# Patient Record
Sex: Female | Born: 1955
Health system: Southern US, Community
[De-identification: ages and names within clinical notes are randomized; demographics above are authoritative.]

## PROBLEM LIST (undated history)

## (undated) DIAGNOSIS — M1990 Unspecified osteoarthritis, unspecified site: Secondary | ICD-10-CM

## (undated) DIAGNOSIS — R85619 Unspecified abnormal cytological findings in specimens from anus: Secondary | ICD-10-CM

## (undated) DIAGNOSIS — B009 Herpesviral infection, unspecified: Secondary | ICD-10-CM

## (undated) DIAGNOSIS — Z96649 Presence of unspecified artificial hip joint: Secondary | ICD-10-CM

## (undated) DIAGNOSIS — R7611 Nonspecific reaction to tuberculin skin test without active tuberculosis: Secondary | ICD-10-CM

## (undated) DIAGNOSIS — Z9289 Personal history of other medical treatment: Secondary | ICD-10-CM

## (undated) DIAGNOSIS — K219 Gastro-esophageal reflux disease without esophagitis: Secondary | ICD-10-CM

## (undated) DIAGNOSIS — D649 Anemia, unspecified: Secondary | ICD-10-CM

## (undated) DIAGNOSIS — J189 Pneumonia, unspecified organism: Secondary | ICD-10-CM

## (undated) DIAGNOSIS — R1033 Periumbilical pain: Secondary | ICD-10-CM

## (undated) DIAGNOSIS — C4491 Basal cell carcinoma of skin, unspecified: Secondary | ICD-10-CM

## (undated) DIAGNOSIS — Z8619 Personal history of other infectious and parasitic diseases: Secondary | ICD-10-CM

## (undated) HISTORY — DX: Herpesviral infection, unspecified: B00.9

## (undated) HISTORY — DX: Nonspecific reaction to tuberculin skin test without active tuberculosis: R76.11

## (undated) HISTORY — DX: Presence of unspecified artificial hip joint: Z96.649

## (undated) HISTORY — DX: Periumbilical pain: R10.33

## (undated) HISTORY — DX: Basal cell carcinoma of skin, unspecified: C44.91

## (undated) HISTORY — DX: Gastro-esophageal reflux disease without esophagitis: K21.9

## (undated) HISTORY — DX: Personal history of other infectious and parasitic diseases: Z86.19

## (undated) HISTORY — PX: LAPAROTOMY: SHX154

## (undated) HISTORY — DX: Unspecified abnormal cytological findings in specimens from anus: R85.619

---
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
## 1992-08-21 DIAGNOSIS — J189 Pneumonia, unspecified organism: Secondary | ICD-10-CM

## 1992-08-21 HISTORY — DX: Pneumonia, unspecified organism: J18.9

## 1998-11-18 ENCOUNTER — Ambulatory Visit (HOSPITAL_COMMUNITY): Admission: RE | Admit: 1998-11-18 | Discharge: 1998-11-18 | Payer: Self-pay | Admitting: Obstetrics and Gynecology

## 1999-05-30 ENCOUNTER — Ambulatory Visit (HOSPITAL_COMMUNITY): Admission: RE | Admit: 1999-05-30 | Discharge: 1999-05-30 | Payer: Self-pay | Admitting: Gastroenterology

## 1999-09-12 ENCOUNTER — Other Ambulatory Visit: Admission: RE | Admit: 1999-09-12 | Discharge: 1999-09-12 | Payer: Self-pay | Admitting: *Deleted

## 2000-01-17 ENCOUNTER — Encounter: Payer: Self-pay | Admitting: Internal Medicine

## 2000-01-17 ENCOUNTER — Ambulatory Visit (HOSPITAL_COMMUNITY): Admission: RE | Admit: 2000-01-17 | Discharge: 2000-01-17 | Payer: Self-pay | Admitting: Internal Medicine

## 2000-09-24 ENCOUNTER — Other Ambulatory Visit: Admission: RE | Admit: 2000-09-24 | Discharge: 2000-09-24 | Payer: Self-pay | Admitting: *Deleted

## 2000-12-31 ENCOUNTER — Emergency Department (HOSPITAL_COMMUNITY): Admission: EM | Admit: 2000-12-31 | Discharge: 2000-12-31 | Payer: Self-pay | Admitting: Emergency Medicine

## 2001-01-22 ENCOUNTER — Ambulatory Visit (HOSPITAL_COMMUNITY): Admission: RE | Admit: 2001-01-22 | Discharge: 2001-01-22 | Payer: Self-pay | Admitting: Internal Medicine

## 2001-01-22 ENCOUNTER — Encounter: Payer: Self-pay | Admitting: Internal Medicine

## 2001-11-05 ENCOUNTER — Other Ambulatory Visit: Admission: RE | Admit: 2001-11-05 | Discharge: 2001-11-05 | Payer: Self-pay | Admitting: Obstetrics and Gynecology

## 2002-01-23 ENCOUNTER — Ambulatory Visit (HOSPITAL_COMMUNITY): Admission: RE | Admit: 2002-01-23 | Discharge: 2002-01-23 | Payer: Self-pay | Admitting: Internal Medicine

## 2002-01-23 ENCOUNTER — Encounter: Payer: Self-pay | Admitting: Internal Medicine

## 2002-12-09 ENCOUNTER — Other Ambulatory Visit: Admission: RE | Admit: 2002-12-09 | Discharge: 2002-12-09 | Payer: Self-pay | Admitting: Obstetrics and Gynecology

## 2003-02-03 ENCOUNTER — Encounter: Payer: Self-pay | Admitting: Internal Medicine

## 2003-02-03 ENCOUNTER — Ambulatory Visit (HOSPITAL_COMMUNITY): Admission: RE | Admit: 2003-02-03 | Discharge: 2003-02-03 | Payer: Self-pay | Admitting: Internal Medicine

## 2004-01-05 ENCOUNTER — Other Ambulatory Visit: Admission: RE | Admit: 2004-01-05 | Discharge: 2004-01-05 | Payer: Self-pay | Admitting: Obstetrics and Gynecology

## 2004-02-15 ENCOUNTER — Ambulatory Visit (HOSPITAL_COMMUNITY): Admission: RE | Admit: 2004-02-15 | Discharge: 2004-02-15 | Payer: Self-pay | Admitting: Internal Medicine

## 2004-06-22 ENCOUNTER — Ambulatory Visit: Payer: Self-pay | Admitting: Internal Medicine

## 2004-07-01 ENCOUNTER — Ambulatory Visit: Payer: Self-pay | Admitting: Internal Medicine

## 2005-03-14 ENCOUNTER — Ambulatory Visit (HOSPITAL_COMMUNITY): Admission: RE | Admit: 2005-03-14 | Discharge: 2005-03-14 | Payer: Self-pay | Admitting: Internal Medicine

## 2005-07-26 ENCOUNTER — Ambulatory Visit: Payer: Self-pay | Admitting: Internal Medicine

## 2005-08-01 ENCOUNTER — Ambulatory Visit: Payer: Self-pay | Admitting: Internal Medicine

## 2005-08-02 ENCOUNTER — Encounter: Admission: RE | Admit: 2005-08-02 | Discharge: 2005-08-02 | Payer: Self-pay | Admitting: Internal Medicine

## 2006-03-28 ENCOUNTER — Ambulatory Visit (HOSPITAL_COMMUNITY): Admission: RE | Admit: 2006-03-28 | Discharge: 2006-03-28 | Payer: Self-pay | Admitting: Internal Medicine

## 2006-08-30 ENCOUNTER — Ambulatory Visit: Payer: Self-pay | Admitting: Internal Medicine

## 2006-08-30 LAB — CONVERTED CEMR LAB
AST: 27 units/L (ref 0–37)
Albumin: 3.9 g/dL (ref 3.5–5.2)
Alkaline Phosphatase: 50 units/L (ref 39–117)
CO2: 32 meq/L (ref 19–32)
Chloride: 102 meq/L (ref 96–112)
Chol/HDL Ratio, serum: 2.7
Cholesterol: 168 mg/dL (ref 0–200)
Eosinophil percent: 2.1 % (ref 0.0–5.0)
GFR calc non Af Amer: 93 mL/min
Glomerular Filtration Rate, Af Am: 113 mL/min/{1.73_m2}
Glucose, Bld: 88 mg/dL (ref 70–99)
LDL Cholesterol: 94 mg/dL (ref 0–99)
Monocytes Absolute: 0.5 10*3/uL (ref 0.2–0.7)
Platelets: 272 10*3/uL (ref 150–400)
Potassium: 4.2 meq/L (ref 3.5–5.1)
RDW: 11.8 % (ref 11.5–14.6)
Total Bilirubin: 1 mg/dL (ref 0.3–1.2)
VLDL: 12 mg/dL (ref 0–40)
WBC: 4.8 10*3/uL (ref 4.5–10.5)

## 2006-09-18 ENCOUNTER — Ambulatory Visit: Payer: Self-pay | Admitting: Internal Medicine

## 2006-09-28 ENCOUNTER — Ambulatory Visit: Payer: Self-pay | Admitting: Internal Medicine

## 2007-03-04 ENCOUNTER — Ambulatory Visit: Payer: Self-pay | Admitting: Internal Medicine

## 2007-04-29 ENCOUNTER — Ambulatory Visit (HOSPITAL_COMMUNITY): Admission: RE | Admit: 2007-04-29 | Discharge: 2007-04-29 | Payer: Self-pay | Admitting: Internal Medicine

## 2007-05-02 ENCOUNTER — Encounter: Admission: RE | Admit: 2007-05-02 | Discharge: 2007-05-02 | Payer: Self-pay | Admitting: Internal Medicine

## 2007-09-23 ENCOUNTER — Encounter: Payer: Self-pay | Admitting: Internal Medicine

## 2007-09-23 ENCOUNTER — Ambulatory Visit (HOSPITAL_COMMUNITY): Admission: RE | Admit: 2007-09-23 | Discharge: 2007-09-23 | Payer: Self-pay | Admitting: Gastroenterology

## 2007-10-08 ENCOUNTER — Encounter: Payer: Self-pay | Admitting: Internal Medicine

## 2007-10-08 DIAGNOSIS — K219 Gastro-esophageal reflux disease without esophagitis: Secondary | ICD-10-CM

## 2007-11-21 ENCOUNTER — Encounter: Admission: RE | Admit: 2007-11-21 | Discharge: 2007-11-21 | Payer: Self-pay | Admitting: Internal Medicine

## 2008-01-20 ENCOUNTER — Ambulatory Visit: Payer: Self-pay | Admitting: Internal Medicine

## 2008-01-20 DIAGNOSIS — R000 Tachycardia, unspecified: Secondary | ICD-10-CM

## 2008-01-27 LAB — CONVERTED CEMR LAB
AST: 20 units/L (ref 0–37)
Albumin: 3.8 g/dL (ref 3.5–5.2)
BUN: 9 mg/dL (ref 6–23)
CO2: 31 meq/L (ref 19–32)
Cholesterol: 154 mg/dL (ref 0–200)
Eosinophils Absolute: 0.1 10*3/uL (ref 0.0–0.7)
Eosinophils Relative: 1.1 % (ref 0.0–5.0)
Free T4: 0.8 ng/dL (ref 0.6–1.6)
GFR calc Af Amer: 113 mL/min
GFR calc non Af Amer: 93 mL/min
HDL: 52.3 mg/dL (ref >39.0)
MCV: 88.6 fL (ref 78.0–100.0)
Monocytes Absolute: 0.4 10*3/uL (ref 0.1–1.0)
Monocytes Relative: 8.3 % (ref 3.0–12.0)
Neutrophils Relative %: 68.4 % (ref 43.0–77.0)
RBC: 4.32 M/uL (ref 3.87–5.11)
RDW: 13.2 % (ref 11.5–14.6)
Sodium: 140 meq/L (ref 135–145)
T3, Free: 2.4 pg/mL (ref 2.3–4.2)
TSH: 2.14 microintl units/mL (ref 0.35–5.50)
Total CHOL/HDL Ratio: 2.9
Triglycerides: 54 mg/dL (ref 0–149)
VLDL: 11 mg/dL (ref 0–40)
WBC: 5.1 10*3/uL (ref 4.5–10.5)

## 2008-02-03 ENCOUNTER — Telehealth: Payer: Self-pay | Admitting: *Deleted

## 2008-02-04 ENCOUNTER — Ambulatory Visit: Payer: Self-pay

## 2008-02-04 ENCOUNTER — Encounter: Payer: Self-pay | Admitting: Internal Medicine

## 2008-03-03 ENCOUNTER — Ambulatory Visit: Payer: Self-pay | Admitting: Cardiology

## 2008-05-01 ENCOUNTER — Encounter: Admission: RE | Admit: 2008-05-01 | Discharge: 2008-05-01 | Payer: Self-pay | Admitting: Internal Medicine

## 2008-08-21 DIAGNOSIS — Z9289 Personal history of other medical treatment: Secondary | ICD-10-CM

## 2008-08-21 DIAGNOSIS — D649 Anemia, unspecified: Secondary | ICD-10-CM

## 2008-08-21 HISTORY — DX: Anemia, unspecified: D64.9

## 2008-08-21 HISTORY — PX: JOINT REPLACEMENT: SHX530

## 2008-08-21 HISTORY — DX: Personal history of other medical treatment: Z92.89

## 2009-02-15 ENCOUNTER — Telehealth: Payer: Self-pay | Admitting: *Deleted

## 2009-03-22 ENCOUNTER — Telehealth: Payer: Self-pay | Admitting: *Deleted

## 2009-05-04 ENCOUNTER — Ambulatory Visit: Payer: Self-pay | Admitting: Internal Medicine

## 2009-05-04 LAB — CONVERTED CEMR LAB
AST: 21 units/L (ref 0–37)
Albumin: 3.8 g/dL (ref 3.5–5.2)
Basophils Absolute: 0 10*3/uL (ref 0.0–0.1)
Basophils Relative: 0.3 % (ref 0.0–3.0)
Bilirubin Urine: NEGATIVE
Blood in Urine, dipstick: NEGATIVE
Cholesterol: 166 mg/dL (ref 0–200)
Eosinophils Absolute: 0.1 10*3/uL (ref 0.0–0.7)
Eosinophils Relative: 1.9 % (ref 0.0–5.0)
Glucose, Urine, Semiquant: NEGATIVE
Hemoglobin: 11.1 g/dL — ABNORMAL LOW (ref 12.0–15.0)
Ketones, urine, test strip: NEGATIVE
Lymphocytes Relative: 26.6 % (ref 12.0–46.0)
Lymphs Abs: 1.2 10*3/uL (ref 0.7–4.0)
MCHC: 32.8 g/dL (ref 30.0–36.0)
MCV: 82.6 fL (ref 78.0–100.0)
Monocytes Absolute: 0.4 10*3/uL (ref 0.1–1.0)
Monocytes Relative: 9.3 % (ref 3.0–12.0)
Nitrite: NEGATIVE
Specific Gravity, Urine: 1.025
TSH: 1.96 microintl units/mL (ref 0.35–5.50)
Total Bilirubin: 0.6 mg/dL (ref 0.3–1.2)
Urobilinogen, UA: 0.2
VLDL: 11.4 mg/dL (ref 0.0–40.0)

## 2009-05-06 ENCOUNTER — Ambulatory Visit (HOSPITAL_COMMUNITY): Admission: RE | Admit: 2009-05-06 | Discharge: 2009-05-06 | Payer: Self-pay | Admitting: Internal Medicine

## 2009-05-11 ENCOUNTER — Ambulatory Visit: Payer: Self-pay | Admitting: Internal Medicine

## 2009-05-11 DIAGNOSIS — M1610 Unilateral primary osteoarthritis, unspecified hip: Secondary | ICD-10-CM | POA: Insufficient documentation

## 2009-05-11 DIAGNOSIS — D649 Anemia, unspecified: Secondary | ICD-10-CM | POA: Insufficient documentation

## 2009-05-19 ENCOUNTER — Inpatient Hospital Stay (HOSPITAL_COMMUNITY): Admission: RE | Admit: 2009-05-19 | Discharge: 2009-05-22 | Payer: Self-pay | Admitting: Orthopedic Surgery

## 2009-06-21 ENCOUNTER — Encounter: Admission: RE | Admit: 2009-06-21 | Discharge: 2009-07-12 | Payer: Self-pay | Admitting: Orthopedic Surgery

## 2010-06-09 ENCOUNTER — Ambulatory Visit (HOSPITAL_COMMUNITY): Admission: RE | Admit: 2010-06-09 | Discharge: 2010-06-09 | Payer: Self-pay | Admitting: Internal Medicine

## 2010-08-21 DIAGNOSIS — K219 Gastro-esophageal reflux disease without esophagitis: Secondary | ICD-10-CM

## 2010-08-21 HISTORY — DX: Gastro-esophageal reflux disease without esophagitis: K21.9

## 2010-09-11 ENCOUNTER — Encounter: Payer: Self-pay | Admitting: Internal Medicine

## 2010-11-24 LAB — CBC
HCT: 27.8 % — ABNORMAL LOW (ref 36.0–46.0)
HCT: 28.3 % — ABNORMAL LOW (ref 36.0–46.0)
Hemoglobin: 9.2 g/dL — ABNORMAL LOW (ref 12.0–15.0)
MCV: 86.8 fL (ref 78.0–100.0)
Platelets: 190 10*3/uL (ref 150–400)
RDW: 15.3 % (ref 11.5–15.5)
RDW: 16.1 % — ABNORMAL HIGH (ref 11.5–15.5)

## 2010-11-24 LAB — BASIC METABOLIC PANEL
BUN: 4 mg/dL — ABNORMAL LOW (ref 6–23)
CO2: 27 mEq/L (ref 19–32)
Calcium: 7.7 mg/dL — ABNORMAL LOW (ref 8.4–10.5)
Potassium: 4.5 mEq/L (ref 3.5–5.1)

## 2010-11-24 LAB — PROTIME-INR
INR: 2.4 — ABNORMAL HIGH (ref 0.00–1.49)
Prothrombin Time: 25.5 seconds — ABNORMAL HIGH (ref 11.6–15.2)
Prothrombin Time: 25.6 seconds — ABNORMAL HIGH (ref 11.6–15.2)

## 2010-11-25 LAB — URINALYSIS, ROUTINE W REFLEX MICROSCOPIC
Glucose, UA: NEGATIVE mg/dL
Nitrite: NEGATIVE
Specific Gravity, Urine: 1.007 (ref 1.005–1.030)
Urobilinogen, UA: 0.2 mg/dL (ref 0.0–1.0)
pH: 6.5 (ref 5.0–8.0)

## 2010-11-25 LAB — ABO/RH: ABO/RH(D): A POS

## 2010-11-25 LAB — TYPE AND SCREEN: ABO/RH(D): A POS

## 2010-11-25 LAB — CBC
HCT: 35.7 % — ABNORMAL LOW (ref 36.0–46.0)
MCHC: 32.4 g/dL (ref 30.0–36.0)
MCHC: 33.3 g/dL (ref 30.0–36.0)
MCV: 84.1 fL (ref 78.0–100.0)
Platelets: 173 10*3/uL (ref 150–400)
RBC: 4.26 MIL/uL (ref 3.87–5.11)
WBC: 11.1 10*3/uL — ABNORMAL HIGH (ref 4.0–10.5)

## 2010-11-25 LAB — BASIC METABOLIC PANEL
BUN: 3 mg/dL — ABNORMAL LOW (ref 6–23)
Chloride: 104 mEq/L (ref 96–112)
Creatinine, Ser: 0.63 mg/dL (ref 0.4–1.2)

## 2010-11-25 LAB — COMPREHENSIVE METABOLIC PANEL
Albumin: 3.8 g/dL (ref 3.5–5.2)
Alkaline Phosphatase: 61 U/L (ref 39–117)
BUN: 12 mg/dL (ref 6–23)
Chloride: 105 mEq/L (ref 96–112)
Creatinine, Ser: 0.66 mg/dL (ref 0.4–1.2)
Glucose, Bld: 84 mg/dL (ref 70–99)

## 2010-11-25 LAB — PROTIME-INR
INR: 1 (ref 0.00–1.49)
Prothrombin Time: 12.8 seconds (ref 11.6–15.2)
Prothrombin Time: 15.7 seconds — ABNORMAL HIGH (ref 11.6–15.2)

## 2010-11-25 LAB — PREGNANCY, URINE: Preg Test, Ur: NEGATIVE

## 2011-01-03 NOTE — Op Note (Signed)
Katherine Rodriguez, Katherine Rodriguez                 ACCOUNT NO.:  [id number]   MEDICAL RECORD NO.:  [id number]          PATIENT TYPE:  AMB   LOCATION:  ENDO                         FACILITY:  [facility]   PHYSICIAN:  James L. Malon Kindle., M.D.DATE OF BIRTH:  October 24, 1955   DATE OF PROCEDURE:  09/23/2007  DATE OF DISCHARGE:                               OPERATIVE REPORT   PROCEDURE:  Esophagogastroduodenoscopy.   MEDICATIONS:  Phenergan 12.5 mg, Hurricaine spray, fentanyl 35 mg,  Versed 3.5 mg IV.   INDICATIONS FOR PROCEDURE:  Persistent dyspepsia despite the use of  Nexium.   DESCRIPTION OF PROCEDURE:  The procedure was explained to the patient  and consent obtained.  In the left lateral decubitus position, the  Pentax upper endoscope was inserted and advanced into the esophagus.  The distal and proximal esophagus were seen well.  The distal esophagus  was reddened.  The GE junction was widely patent.  There were no signs  of Barrett's esophagus.  No esophagitis, ulcerations, etc.  A hiatal  hernia was only approximately 2 cm in length.  The stomach was entered.  The pylorus identified and passed.  The  duodenum, including the bulb  and second portion, were seen well and were unremarkable.  The pyloric  channel, antrum, and body were normal.  The fundus and cardia were seen  well on the retroflex view and were normal.  The scope was withdrawn.  The initial findings of the esophagus were confirmed.   ASSESSMENT:  Gastroesophageal reflux disease with no signs of Barrett's  esophagus.   PLAN:  We will continue conservative therapy with Nexium, antireflux  measures.           ______________________________  Llana Aliment Malon Kindle., M.D.     Waldron Session  D:  09/23/2007  T:  09/23/2007  Job:  [id number]   cc:   Neta Mends. Fabian Sharp, MD

## 2011-01-03 NOTE — Op Note (Signed)
Katherine Rodriguez, Katherine Rodriguez                 ACCOUNT NO.:  [id number]   MEDICAL RECORD NO.:  [id number]          PATIENT TYPE:  AMB   LOCATION:  ENDO                         FACILITY:  [facility]   PHYSICIAN:  James L. Malon Kindle., M.D.DATE OF BIRTH:  [birth year]   DATE OF PROCEDURE:  09/23/2007  DATE OF DISCHARGE:                               OPERATIVE REPORT   PROCEDURE:  Colonoscopy.   MEDICATIONS:  The patient received a total of Phenergan 12.5 mg,  fentanyl 85 mg and Versed 8.5 mg IV for both procedures.   INDICATIONS:  Strong family history of colon polyps.  This is done as a  5-year follow-up colonoscopy.   DESCRIPTION OF PROCEDURE:  Following endoscopy the patient was turned  around and the Pentax pediatric scope was inserted and advanced.  Prep  excellent.  The cecum reached without difficulty.  Scope withdrawn.  Cecum, ascending, transverse, descending, sigmoid colon seen well.  No  abnormalities seen.  Rectum was examined in the forward and retroflex  view and was normal.  There were some small hemorrhoids.  Pertinent  negatives were polyps, diverticula, tumors, masses, melanosis etc.  The  patient tolerated the procedure well, was monitored throughout.   ASSESSMENT:  Strong family history colon polyps in her father with a  negative colonoscopy at this time.   PLAN:  We will follow the patient clinically and recommend a 5-year  repeat colonoscopy.           ______________________________  Llana Aliment. Malon Kindle., M.D.     Waldron Session  D:  09/23/2007  T:  09/23/2007  Job:  [id number]   cc:   Neta Mends. Fabian Sharp, MD

## 2011-01-03 NOTE — Assessment & Plan Note (Signed)
[facility]                            CARDIOLOGY OFFICE NOTE   Katherine Rodriguez                        MRN:          [id number]  DATE:03/03/2008                            DOB:          18-Dec-1955    CHIEF COMPLAINT:  Rapid heart beat.   HISTORY OF PRESENT ILLNESS:  Katherine Rodriguez is a delightful 56 year old  married white female, former nurse at Neuro Intensive Care Unit at [employer],  who comes today with the above complaint.  She had a bad episode in  March which was evaluated by Dr. Fabian Sharp.  She has apparently had her  thyroid checked on thyroid replacement, has been euthyroid.  She then  had an episode at the end of May when she was at work.  Within about 2  minutes one of her co-workers checked her heart rate which was running  about 110-120 and regular.  Her blood pressure was 110.   She describes the events as coming on suddenly, but gradually going  away.  She has had no syncope, presyncope, chest pain, or diaphoresis.  This is fairly infrequent having only two episodes since March.   She has had no previous cardiac history.   Since these started, she has cut way back on caffeine which seems to  have helped.   PAST MEDICAL HISTORY:  She smoked up until 1988.  She has less than  three alcoholic beverages a month.  She does not binge drink.  She has  cut her caffeine way back.  She walks about 1-2 miles 3 times a week.   CURRENT MEDICATIONS:  1. Nexium 40 mg a day.  2. Multivitamin daily.  3. Osteo Bi-Flex daily.   ALLERGIES:  She has no known drug allergies.   PAST SURGICAL HISTORY:  Partial thyroidectomy in 1973, laparoscopy x2  for endometriosis in 1985-1987.   FAMILY HISTORY:  Her family history is negative for premature coronary  disease.  Her brother has an irregular heart beat at age 8.   SOCIAL HISTORY:  She is married, and has one child.  She lives in  [place].  She does preop PACU and discharge at an outpatient surgical  center.   REVIEW OF SYSTEMS:  She has some chronic constipation, hiatal hernia,  reflux, menstrual dysfunction which she thinks may be related to  palpitations and arthritis.  Other review of systems are negative.   PHYSICAL EXAMINATION:  VITAL SIGNS:  Her blood pressure today is 102/60,  her pulse 62 and regular.  She is 5 feet 4.5, weighs 146 pounds.  HEENT:  Normocephalic, atraumatic.  PERRLA.  Extraocular movement is  intact.  Sclerae are clear.  She wears glasses.  Facial symmetry is  normal.  Dentition satisfactory.  NECK:  Supple.  Carotids show good upstroke bilaterally without bruits.  She has a thyroidectomy scar.  There is no thyromegaly.  LUNGS:  Clear to auscultation and percussion.  HEART:  Reveals a regular rate and rhythm.  Nondisplaced PMI.  There is  no click.  ABDOMEN:  Exam is soft.  Good bowel sounds.  No midline  bruit.  No  hepatomegaly.  EXTREMITIES:  No cyanosis, clubbing, or edema.  Pulses are intact.  NEURO:  Exam is intact.   EKG:  Her EKG is completely normal.  A 2D echocardiogram obtained in  this office on February 04, 2008, was completely normal.   ASSESSMENT:  Tachy palpitations most likely not supraventricular  tachycardia, atrial fibrillation or flutter.  These are fairly  infrequent and have seem to have gotten better with conservative changes  in her lifestyle including decreasing caffeine.  Some of this may be  perimenopausal.   RECOMMENDATIONS:  1. Low-dose estrogen if this helps other menopausal symptoms.  2. Reassurance.  3. Continue to increase exercise and decrease caffeine intake.  4. See me back if she starts having sustained or frequent arrhythmias.      She knows to obtain a 12-lead EKG if she is in it for a significant      period of time.  Otherwise, I will see her back p.r.n.     Thomas C. Daleen Squibb, MD, [facility]  Electronically Signed    TCW/MedQ  DD: 03/03/2008  DT: 03/04/2008  Job #: [id number]   cc:   Neta Mends. Fabian Sharp, MD

## 2011-03-06 ENCOUNTER — Other Ambulatory Visit (HOSPITAL_COMMUNITY): Payer: Self-pay | Admitting: Orthopedic Surgery

## 2011-03-06 DIAGNOSIS — F430 Acute stress reaction: Secondary | ICD-10-CM

## 2011-03-14 ENCOUNTER — Encounter (HOSPITAL_COMMUNITY)
Admission: RE | Admit: 2011-03-14 | Discharge: 2011-03-14 | Disposition: A | Discharge status: Home / Self Care | Payer: 59 | Source: Ambulatory Visit | Attending: Orthopedic Surgery | Admitting: Orthopedic Surgery

## 2011-03-14 ENCOUNTER — Ambulatory Visit (HOSPITAL_COMMUNITY)
Admission: RE | Admit: 2011-03-14 | Discharge: 2011-03-14 | Disposition: A | Discharge status: Home / Self Care | Payer: 59 | Source: Ambulatory Visit | Attending: Orthopedic Surgery | Admitting: Orthopedic Surgery

## 2011-03-14 DIAGNOSIS — Z96649 Presence of unspecified artificial hip joint: Secondary | ICD-10-CM | POA: Insufficient documentation

## 2011-03-14 DIAGNOSIS — F430 Acute stress reaction: Secondary | ICD-10-CM

## 2011-03-14 DIAGNOSIS — M79609 Pain in unspecified limb: Secondary | ICD-10-CM | POA: Insufficient documentation

## 2011-03-14 MED ORDER — TECHNETIUM TC 99M MEDRONATE IV KIT
25.0000 | PACK | Freq: Once | INTRAVENOUS | Status: AC | PRN
  Administered 2011-03-14: 25 via INTRAVENOUS

## 2011-03-29 ENCOUNTER — Telehealth: Payer: Self-pay | Admitting: Internal Medicine

## 2011-03-29 DIAGNOSIS — Z13820 Encounter for screening for osteoporosis: Secondary | ICD-10-CM

## 2011-03-29 NOTE — Telephone Encounter (Signed)
Pt called and said that she is having some stress reaction activity at tip of prosthesis. Pt is req to get a bone density scan ordered before cpx in December.

## 2011-03-30 NOTE — Telephone Encounter (Signed)
Left message on machine that we will call her back about scheduling the bone density once md addresses the message.

## 2011-03-30 NOTE — Telephone Encounter (Signed)
Dr. Fabian Sharp would need to order this

## 2011-04-05 NOTE — Telephone Encounter (Signed)
Left message to call back  

## 2011-04-05 NOTE — Telephone Encounter (Signed)
Ok to schedule DEXA  .Dx post menopausal,  Abnormal bone scan,   Osteopenia.

## 2011-04-06 NOTE — Telephone Encounter (Signed)
Pt called back and has never had a dexa done. I scheduled appt for 8/23 at 9am. Left message for pt about this.

## 2011-04-13 ENCOUNTER — Ambulatory Visit (INDEPENDENT_AMBULATORY_CARE_PROVIDER_SITE_OTHER)
Admission: RE | Admit: 2011-04-13 | Discharge: 2011-04-13 | Disposition: A | Discharge status: Home / Self Care | Payer: 59 | Source: Ambulatory Visit

## 2011-04-13 DIAGNOSIS — Z13820 Encounter for screening for osteoporosis: Secondary | ICD-10-CM

## 2011-04-28 ENCOUNTER — Encounter: Payer: Self-pay | Admitting: *Deleted

## 2011-06-13 ENCOUNTER — Other Ambulatory Visit: Payer: Self-pay | Admitting: Internal Medicine

## 2011-06-13 DIAGNOSIS — Z1231 Encounter for screening mammogram for malignant neoplasm of breast: Secondary | ICD-10-CM

## 2011-07-12 ENCOUNTER — Other Ambulatory Visit (INDEPENDENT_AMBULATORY_CARE_PROVIDER_SITE_OTHER): Payer: 59

## 2011-07-12 DIAGNOSIS — Z0000 Encounter for general adult medical examination without abnormal findings: Secondary | ICD-10-CM

## 2011-07-12 LAB — CBC WITH DIFFERENTIAL/PLATELET
Basophils Absolute: 0 10*3/uL (ref 0.0–0.1)
Lymphocytes Relative: 29.2 % (ref 12.0–46.0)
Monocytes Relative: 10.5 % (ref 3.0–12.0)
Platelets: 219 10*3/uL (ref 150.0–400.0)
RDW: 13 % (ref 11.5–14.6)

## 2011-07-12 LAB — BASIC METABOLIC PANEL
BUN: 13 mg/dL (ref 6–23)
Calcium: 8.9 mg/dL (ref 8.4–10.5)
Creatinine, Ser: 0.7 mg/dL (ref 0.4–1.2)
GFR: 96.43 mL/min (ref >60.00)
Glucose, Bld: 82 mg/dL (ref 70–99)

## 2011-07-12 LAB — HEPATIC FUNCTION PANEL
AST: 20 U/L (ref 0–37)
Alkaline Phosphatase: 58 U/L (ref 39–117)
Total Bilirubin: 0.9 mg/dL (ref 0.3–1.2)

## 2011-07-12 LAB — POCT URINALYSIS DIPSTICK
Blood, UA: NEGATIVE
Ketones, UA: NEGATIVE
Leukocytes, UA: NEGATIVE
Protein, UA: NEGATIVE
pH, UA: 8.5

## 2011-07-12 LAB — LIPID PANEL
HDL: 62.2 mg/dL (ref >39.00)
Total CHOL/HDL Ratio: 2
Triglycerides: 66 mg/dL (ref 0.0–149.0)
VLDL: 13.2 mg/dL (ref 0.0–40.0)

## 2011-07-12 LAB — TSH: TSH: 1.76 u[IU]/mL (ref 0.35–5.50)

## 2011-07-18 ENCOUNTER — Ambulatory Visit (HOSPITAL_COMMUNITY)
Admission: RE | Admit: 2011-07-18 | Discharge: 2011-07-18 | Disposition: A | Discharge status: Home / Self Care | Payer: 59 | Source: Ambulatory Visit | Attending: Internal Medicine | Admitting: Internal Medicine

## 2011-07-18 DIAGNOSIS — Z1231 Encounter for screening mammogram for malignant neoplasm of breast: Secondary | ICD-10-CM | POA: Insufficient documentation

## 2011-07-31 ENCOUNTER — Encounter: Payer: Self-pay | Admitting: Internal Medicine

## 2011-08-01 ENCOUNTER — Ambulatory Visit (INDEPENDENT_AMBULATORY_CARE_PROVIDER_SITE_OTHER): Payer: 59 | Admitting: Internal Medicine

## 2011-08-01 ENCOUNTER — Encounter: Payer: Self-pay | Admitting: Internal Medicine

## 2011-08-01 VITALS — BP 100/70 | HR 60 | Ht 64.25 in | Wt 140.0 lb

## 2011-08-01 DIAGNOSIS — Z96649 Presence of unspecified artificial hip joint: Secondary | ICD-10-CM

## 2011-08-01 DIAGNOSIS — R141 Gas pain: Secondary | ICD-10-CM

## 2011-08-01 DIAGNOSIS — Z0000 Encounter for general adult medical examination without abnormal findings: Secondary | ICD-10-CM

## 2011-08-01 DIAGNOSIS — Z8619 Personal history of other infectious and parasitic diseases: Secondary | ICD-10-CM

## 2011-08-01 DIAGNOSIS — M949 Disorder of cartilage, unspecified: Secondary | ICD-10-CM

## 2011-08-01 DIAGNOSIS — M899 Disorder of bone, unspecified: Secondary | ICD-10-CM

## 2011-08-01 DIAGNOSIS — M8580 Other specified disorders of bone density and structure, unspecified site: Secondary | ICD-10-CM | POA: Insufficient documentation

## 2011-08-01 DIAGNOSIS — R140 Abdominal distension (gaseous): Secondary | ICD-10-CM | POA: Insufficient documentation

## 2011-08-01 HISTORY — DX: Presence of unspecified artificial hip joint: Z96.649

## 2011-08-01 HISTORY — DX: Personal history of other infectious and parasitic diseases: Z86.19

## 2011-08-01 MED ORDER — VALACYCLOVIR HCL 1 G PO TABS: ORAL_TABLET | ORAL | Status: AC

## 2011-08-01 MED ORDER — POLYETHYLENE GLYCOL 3350 17 GM/SCOOP PO POWD: 17.0000 g | Freq: Every day | ORAL | Status: DC

## 2011-08-01 NOTE — Progress Notes (Signed)
Subjective:    Patient ID: Katherine Rodriguez, female    DOB: Jan 08, 1956, 55 y.o.   MRN: [id number]  HPI Patient comes in today for Preventive Health Care visit  No major change in health status since last visit . except  Except  Hip replacement   Sept 2010 total right hip and had .An helped  And had dexa. Because of some sx but has done well since then  Issues irreg gas bloated at times  Using miralax q d  Some fiber.  Sore hemorrhoid and some bleeding. Last colon 7-8 years ago.   May be partial menopausal Review of Systems ROS:  GEN/ HEENTNo fever, significant weight changes sweats headaches vision problems hearing changes, CV/ PULM; No chest pain shortness of breath cough, syncope,edema  change in exercise tolerance. GI /GU: No adominal pain, vomiting, No blood in the stool. No significant GU symptoms. SKIN/HEME: ,no acute skin rashes suspicious lesions or bleeding. No lymphadenopathy, nodules, masses.  NEURO/ PSYCH:  No neurologic signs such as weakness numbness No depression anxiety. IMM/ Allergy: No unusual infections.  Allergy .   REST of 12 system review negative Past Medical History  Diagnosis Date  . Endometriosis   . Abnormal Pap smear of anus   . PPD positive   . GERD (gastroesophageal reflux disease)   . Recurrent HSV (herpes simplex virus)     History   Social History  . Marital Status: Married    Spouse Name: N/A    Number of Children: N/A  . Years of Education: N/A   Occupational History  . Not on file.   Social History Main Topics  . Smoking status: Never Smoker   . Smokeless tobacco: Not on file  . Alcohol Use: Yes     rare  . Drug Use: Not on file  . Sexually Active: Not on file   Other Topics Concern  . Not on file   Social History Narrative   MarriedRegular exercise- yesCutting back on caffeineSleep 7 HH 3 pet dog and cat. G4 P1     Past Surgical History  Procedure Date  . Thyroidectomy, partial   . Laparotomy     exploratory    Family  History  Problem Relation Age of Onset  . Hypertension Mother   . Arthritis Mother     spinal stenosis  . Lymphoma    . Other      bowel disease    No Known Allergies  Current Outpatient Prescriptions on File Prior to Visit  Medication Sig Dispense Refill  . naproxen sodium (ALEVE) 220 MG tablet Take 220 mg by mouth 2 (two) times daily with a meal.          BP 100/70  Pulse 60  Ht 5' 4.25" (1.632 m)  Wt 140 lb (63.504 kg)  BMI 23.84 kg/m2       Objective:   Physical Exam Physical Exam: Vital signs reviewed WUJ:WJXB is a well-developed well-nourished alert cooperative  white female who appears her stated age in no acute distress.  HEENT: normocephalic atraumatic , Eyes: PERRL EOM's full, conjunctiva clear, Nares: paten,t no deformity discharge or tenderness., Ears: no deformity EAC's clear TMs with normal landmarks. Mouth: clear OP, no lesions, edema.  Moist mucous membranes. Dentition in adequate repair. NECK: supple without masses, thyromegaly or bruits. CHEST/PULM:  Clear to auscultation and percussion breath sounds equal no wheeze , rales or rhonchi. No chest wall deformities or tenderness. CV: PMI is nondisplaced, S1 S2 no gallops, murmurs,  rubs. Peripheral pulses are full without delay.No JVD . Breast: normal by inspection . No dimpling, discharge, masses, tenderness or discharge .  ABDOMEN: Bowel sounds normal nontender  No guard or rebound, no hepato splenomegal no CVA tenderness.  No hernia. Extremtities:  No clubbing cyanosis or edema, no acute joint swelling or redness no focal atrophy NEURO:  Oriented x3, cranial nerves 3-12 appear to be intact, no obvious focal weakness,gait within normal limits no abnormal reflexes or asymmetrical SKIN: No acute rashes normal turgor, color, no bruising or petechiae. PSYCH: Oriented, good eye contact, no obvious depression anxiety, cognition and judgment appear normal. LN: no cervical axillary inguinal adenopathy  Lab Results    Component Value Date   WBC 4.6 07/12/2011   HGB 13.1 07/12/2011   HCT 39.5 07/12/2011   PLT 219.0 07/12/2011   GLUCOSE 82 07/12/2011   CHOL 153 07/12/2011   TRIG 66.0 07/12/2011   HDL 62.20 07/12/2011   LDLCALC 78 07/12/2011   ALT 24 07/12/2011   AST 20 07/12/2011   NA 142 07/12/2011   K 4.4 07/12/2011   CL 106 07/12/2011   CREATININE 0.7 07/12/2011   BUN 13 07/12/2011   CO2 32 07/12/2011   TSH 1.76 07/12/2011   INR 2.4* 05/22/2009  DEXA   LSSP -1.0 and Hip -1.9       Assessment & Plan:  Preventive Health Care Counseled regarding healthy nutrition, exercise, sleep, injury prevention, calcium vit d and healthy weight . Osteopenia  Mild    Disc ca vit d   Sp hip replacement hsv ; Prn valtrex .   Constipation and poss change in bowel habits . To see her GE about this  May just be ibs type problem.

## 2011-08-01 NOTE — Patient Instructions (Signed)
Continue lifestyle intervention healthy eating and exercise . You have changes of osteopenia .  Would continue diet with vitamin d and calcium .   Get repeat DEXA bone density  When 58 years.

## 2011-10-18 ENCOUNTER — Other Ambulatory Visit: Payer: Self-pay | Admitting: Gastroenterology

## 2011-10-18 DIAGNOSIS — K921 Melena: Secondary | ICD-10-CM

## 2011-10-18 DIAGNOSIS — R198 Other specified symptoms and signs involving the digestive system and abdomen: Secondary | ICD-10-CM

## 2011-11-06 ENCOUNTER — Ambulatory Visit
Admission: RE | Admit: 2011-11-06 | Discharge: 2011-11-06 | Disposition: A | Discharge status: Home / Self Care | Payer: 59 | Source: Ambulatory Visit | Attending: Gastroenterology | Admitting: Gastroenterology

## 2011-11-06 DIAGNOSIS — R198 Other specified symptoms and signs involving the digestive system and abdomen: Secondary | ICD-10-CM

## 2011-11-06 DIAGNOSIS — K921 Melena: Secondary | ICD-10-CM

## 2012-03-11 ENCOUNTER — Other Ambulatory Visit: Payer: Self-pay | Admitting: Internal Medicine

## 2012-07-17 ENCOUNTER — Other Ambulatory Visit: Payer: Self-pay | Admitting: Internal Medicine

## 2012-07-17 DIAGNOSIS — Z1231 Encounter for screening mammogram for malignant neoplasm of breast: Secondary | ICD-10-CM

## 2012-07-31 ENCOUNTER — Encounter: Payer: Self-pay | Admitting: Internal Medicine

## 2012-07-31 ENCOUNTER — Encounter: Payer: Self-pay | Admitting: Family Medicine

## 2012-07-31 ENCOUNTER — Ambulatory Visit (INDEPENDENT_AMBULATORY_CARE_PROVIDER_SITE_OTHER): Payer: 59 | Admitting: Internal Medicine

## 2012-07-31 VITALS — BP 102/64 | HR 86 | Temp 98.6°F | Wt 141.0 lb

## 2012-07-31 DIAGNOSIS — J040 Acute laryngitis: Secondary | ICD-10-CM

## 2012-07-31 DIAGNOSIS — J069 Acute upper respiratory infection, unspecified: Secondary | ICD-10-CM

## 2012-07-31 NOTE — Progress Notes (Signed)
Chief Complaint  Patient presents with  . Hoarse  . Sore Throat  . Otalgia  . Headache    Started over the weekend and has gotten worse.  . Nasal Congestion  . Cough    HPI: Patient comes in today for SDA for  new problem evaluation. Onset less that week  Sore throat and  Congestion and now hoarse  .  Marland Kitchen  No fever .  Increase coughing and slight production.    No sob.   Trying alka selter plus ibu and sudafed  Helped with head congestion.   Ear pain left more than right.   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  Past Medical History  Diagnosis Date  . Endometriosis   . Abnormal Pap smear of anus   . PPD positive   . GERD (gastroesophageal reflux disease)   . Recurrent HSV (herpes simplex virus)   . S/P hip replacement 08/01/2011  . History of herpes labialis 08/01/2011    Family History  Problem Relation Age of Onset  . Hypertension Mother   . Arthritis Mother     spinal stenosis  . Lymphoma    . Other      bowel disease    History   Social History  . Marital Status: Married    Spouse Name: N/A    Number of Children: N/A  . Years of Education: N/A   Social History Main Topics  . Smoking status: Never Smoker   . Smokeless tobacco: None  . Alcohol Use: Yes     Comment: rare  . Drug Use: None  . Sexually Active: None   Other Topics Concern  . None   Social History Narrative   MarriedRegular exercise- yesCutting back on caffeineSleep 7 HH 3 pet dog and cat. Works hospital nursingG4 P1     Outpatient Encounter Prescriptions as of 07/31/2012  Medication Sig Dispense Refill  . celecoxib (CELEBREX) 200 MG capsule Take 200 mg by mouth daily.      . naproxen sodium (ALEVE) 220 MG tablet Take 220 mg by mouth 2 (two) times daily with a meal.        . polyethylene glycol powder (GLYCOLAX/MIRALAX) powder TAKE 17 G (DISSOLVED IN 8 OZ LIQUID) BY MOUTH DAILY.  527 g  1  . valACYclovir (VALTREX) 1000 MG tablet 2 po bid as  directed  30 tablet  3    EXAM:  BP 102/64  Pulse 86  Temp 98.6 F (37 C) (Oral)  Wt 141 lb (63.957 kg)  SpO2 98%  There is no height on file to calculate BMI.  GENERAL: vitals reviewed and listed above, alert, oriented, appears well hydrated and in no acute distress  WDWN in NAD  quiet respirations; mildly congested  hoarse. Non toxic .no stridor  HEENT: Normocephalic ;atraumatic , Eyes;  PERRL, EOMs  Full, lids and conjunctiva clear,,Ears: no deformities, canals nl, TM landmarks normal, Nose: no deformity or discharge but congested;face non tender Mouth : OP clear without lesion or edema . Neck: Supple without adenopathy or masses or bruits Chest:  Clear to A&P without wheezes rales or rhonchi CV:  S1-S2 no gallops or murmurs peripheral perfusion is normal Skin :nl perfusion and no acute rashes   PSYCH: pleasant and cooperative, no obvious depression or anxiety  ASSESSMENT AND PLAN:  Discussed the following assessment and plan:  1. Acute laryngitis    viral   2. URI, acute    -  Patient advised to return or notify health care team  immediately if symptoms worsen or persist or new concerns arise.  Patient Instructions  Your ears are not infected today. This acts like a viral laryngitis upper respiratory infection. Should run its course and go back to work on Monday. Relative voice rest warm liquids symptom treatment Sudafed during the day can use Afrin nose spray at night for 2-3 nights. Contact us if persistent or progressive lasting more than 10-14 days without improvement. Or high fever shortness of breath.  Contact our office if you require  rx cough med for comfort .    INSTRUCTIONS FOR UPPER RESPIRATORY INFECTION:  -plenty of rest and fluids  -nasal saline wash 2-3 times daily (use prepackaged nasal saline or bottled/distilled water if making your own)  -clean nose with nasal saline before using   sinex  afrin -can use sinex nasal spray for drainage and nasal  congestion - but do NOT use longer then 3-4 days overuses can cause rebound problems . Can alternate nostrils. -can use tylenol or ibuprofen as directed for aches and sorethroat  -in the winter time, using a humidifier at night is helpful (please follow cleaning instructions)  -if you are taking a cough medication - use only as directed, may also try a teaspoon of honey to coat the throat and throat lozenges for short term use. -for sore throat, salt water gargles can help  -follow up if you have fevers, facial pain, tooth pain, difficulty breathing or are worsening or not beginning to get better in 7 days      Laryngitis At the top of your windpipe is your voice box. It is the source of your voice. Inside your voice box are 2 bands of muscles called vocal cords. When you breathe, your vocal cords are relaxed and open so that air can get into the lungs. When you decide to say something, these cords come together and vibrate. The sound from these vibrations goes into your throat and comes out through your mouth as sound. Laryngitis is an inflammation of the vocal cords that causes hoarseness, cough, loss of voice, sore throat, and dry throat. Laryngitis can be temporary (acute) or long-term (chronic). Most cases of acute laryngitis improve with time.Chronic laryngitis lasts for more than 3 weeks. CAUSES Laryngitis can often be related to excessive smoking, talking, or yelling, as well as inhalation of toxic fumes and allergies. Acute laryngitis is usually caused by a viral infection, vocal strain, measles or mumps, or bacterial infections. Chronic laryngitis is usually caused by vocal cord strain, vocal cord injury, postnasal drip, growths on the vocal cords, or acid reflux. SYMPTOMS   Cough.  Sore throat.  Dry throat. RISK FACTORS  Respiratory infections.  Exposure to irritating substances, such as cigarette smoke, excessive amounts of alcohol, stomach acids, and workplace  chemicals.  Voice trauma, such as vocal cord injury from shouting or speaking too loud. DIAGNOSIS  Your cargiver will perform a physical exam. During the physical exam, your caregiver will examine your throat. The most common sign of laryngitis is hoarseness. Laryngoscopy may be necessary to confirm the diagnosis of this condition. This procedure allows your caregiver to look into the larynx. HOME CARE INSTRUCTIONS  Drink enough fluids to keep your urine clear or pale yellow.  Rest until you no longer have symptoms or as directed by your caregiver.  Breathe in moist air.  Take all medicine as directed by your caregiver.  Do not smoke.  Talk as  little as possible (this includes whispering).  Write on paper instead of talking until your voice is back to normal.  Follow up with your caregiver if your condition has not improved after 10 days. SEEK MEDICAL CARE IF:   You have trouble breathing.  You cough up blood.  You have persistent fever.  You have increasing pain.  You have difficulty swallowing. MAKE SURE YOU:  Understand these instructions.  Will watch your condition.  Will get help right away if you are not doing well or get worse. Document Released: 08/07/2005 Document Revised: 10/30/2011 Document Reviewed: 10/13/2010 [facility] Patient Information 2013 [place], [place].      Neta Mends. Kiaya Haliburton M.D.

## 2012-07-31 NOTE — Patient Instructions (Addendum)
Your ears are not infected today. This acts like a viral laryngitis upper respiratory infection. Should run its course and go back to work on Monday. Relative voice rest warm liquids symptom treatment Sudafed during the day can use Afrin nose spray at night for 2-3 nights. Contact us if persistent or progressive lasting more than 10-14 days without improvement. Or high fever shortness of breath.  Contact our office if you require  rx cough med for comfort .    INSTRUCTIONS FOR UPPER RESPIRATORY INFECTION:  -plenty of rest and fluids  -nasal saline wash 2-3 times daily (use prepackaged nasal saline or bottled/distilled water if making your own)  -clean nose with nasal saline before using   sinex  afrin -can use sinex nasal spray for drainage and nasal congestion - but do NOT use longer then 3-4 days overuses can cause rebound problems . Can alternate nostrils. -can use tylenol or ibuprofen as directed for aches and sorethroat  -in the winter time, using a humidifier at night is helpful (please follow cleaning instructions)  -if you are taking a cough medication - use only as directed, may also try a teaspoon of honey to coat the throat and throat lozenges for short term use. -for sore throat, salt water gargles can help  -follow up if you have fevers, facial pain, tooth pain, difficulty breathing or are worsening or not beginning to get better in 7 days      Laryngitis At the top of your windpipe is your voice box. It is the source of your voice. Inside your voice box are 2 bands of muscles called vocal cords. When you breathe, your vocal cords are relaxed and open so that air can get into the lungs. When you decide to say something, these cords come together and vibrate. The sound from these vibrations goes into your throat and comes out through your mouth as sound. Laryngitis is an inflammation of the vocal cords that causes hoarseness, cough, loss of voice, sore throat, and dry throat.  Laryngitis can be temporary (acute) or long-term (chronic). Most cases of acute laryngitis improve with time.Chronic laryngitis lasts for more than 3 weeks. CAUSES Laryngitis can often be related to excessive smoking, talking, or yelling, as well as inhalation of toxic fumes and allergies. Acute laryngitis is usually caused by a viral infection, vocal strain, measles or mumps, or bacterial infections. Chronic laryngitis is usually caused by vocal cord strain, vocal cord injury, postnasal drip, growths on the vocal cords, or acid reflux. SYMPTOMS   Cough.  Sore throat.  Dry throat. RISK FACTORS  Respiratory infections.  Exposure to irritating substances, such as cigarette smoke, excessive amounts of alcohol, stomach acids, and workplace chemicals.  Voice trauma, such as vocal cord injury from shouting or speaking too loud. DIAGNOSIS  Your cargiver will perform a physical exam. During the physical exam, your caregiver will examine your throat. The most common sign of laryngitis is hoarseness. Laryngoscopy may be necessary to confirm the diagnosis of this condition. This procedure allows your caregiver to look into the larynx. HOME CARE INSTRUCTIONS  Drink enough fluids to keep your urine clear or pale yellow.  Rest until you no longer have symptoms or as directed by your caregiver.  Breathe in moist air.  Take all medicine as directed by your caregiver.  Do not smoke.  Talk as little as possible (this includes whispering).  Write on paper instead of talking until your voice is back to normal.  Follow up with your  caregiver if your condition has not improved after 10 days. SEEK MEDICAL CARE IF:   You have trouble breathing.  You cough up blood.  You have persistent fever.  You have increasing pain.  You have difficulty swallowing. MAKE SURE YOU:  Understand these instructions.  Will watch your condition.  Will get help right away if you are not doing well or get  worse. Document Released: 08/07/2005 Document Revised: 10/30/2011 Document Reviewed: 10/13/2010 [facility] Patient Information 2013 [place], [place].

## 2012-08-07 ENCOUNTER — Ambulatory Visit (HOSPITAL_COMMUNITY)
Admission: RE | Admit: 2012-08-07 | Discharge: 2012-08-07 | Disposition: A | Discharge status: Home / Self Care | Payer: 59 | Source: Ambulatory Visit | Attending: Internal Medicine | Admitting: Internal Medicine

## 2012-08-07 DIAGNOSIS — Z1231 Encounter for screening mammogram for malignant neoplasm of breast: Secondary | ICD-10-CM | POA: Insufficient documentation

## 2012-08-30 ENCOUNTER — Ambulatory Visit (INDEPENDENT_AMBULATORY_CARE_PROVIDER_SITE_OTHER): Payer: Commercial Managed Care - PPO | Admitting: General Surgery

## 2012-08-30 ENCOUNTER — Encounter (INDEPENDENT_AMBULATORY_CARE_PROVIDER_SITE_OTHER): Payer: Self-pay | Admitting: General Surgery

## 2012-08-30 VITALS — BP 122/64 | HR 67 | Temp 97.8°F | Resp 18 | Ht 64.5 in | Wt 141.0 lb

## 2012-08-30 DIAGNOSIS — K644 Residual hemorrhoidal skin tags: Secondary | ICD-10-CM

## 2012-08-30 NOTE — Patient Instructions (Signed)
When you have an acute event, use witch hazel and apply to the area 4 times a day, apply Preparation H cream 4 times a day, use a warm water sitz bath. Do not use wipes. Use very soft tissue paper and warm water to clean yourself. May also use the sitz bath device to clean yourself.

## 2012-08-30 NOTE — Progress Notes (Signed)
Patient ID: Katherine Rodriguez, female   DOB: 1956-02-04, 57 y.o.   MRN: [id number]  Chief Complaint  Patient presents with  . Routine Post Op    Hems    HPI Katherine Rodriguez is a 57 y.o. female.   HPI  She is self-referred. She presents for further evaluation of anal pain, itching, burning, and fullness sensation. She has a long history of external hemorrhoids. She does also long history of constipation although is able to control this with MiraLAX. Recently she's felt this uncomfortable fullness at times which is something new for her. Some intermittent bleeding.  She has had an incision and drainage of a thrombosed external hemorrhoid in the past.  She does use wipes to clean herself after bowel movements.  Past Medical History  Diagnosis Date  . Endometriosis   . Abnormal Pap smear of anus   . PPD positive   . GERD (gastroesophageal reflux disease)   . Recurrent HSV (herpes simplex virus)   . S/P hip replacement 08/01/2011  . History of herpes labialis 08/01/2011    Past Surgical History  Procedure Date  . Thyroidectomy, partial   . Laparotomy     exploratory    Family History  Problem Relation Age of Onset  . Hypertension Mother   . Arthritis Mother     spinal stenosis  . Lymphoma    . Other      bowel disease    Social History History  Substance Use Topics  . Smoking status: Never Smoker   . Smokeless tobacco: Not on file  . Alcohol Use: Yes     Comment: rare    No Known Allergies  Current Outpatient Prescriptions  Medication Sig Dispense Refill  . celecoxib (CELEBREX) 200 MG capsule Take 200 mg by mouth daily.      . naproxen sodium (ALEVE) 220 MG tablet Take 220 mg by mouth 2 (two) times daily with a meal.        . polyethylene glycol powder (GLYCOLAX/MIRALAX) powder TAKE 17 G (DISSOLVED IN 8 OZ LIQUID) BY MOUTH DAILY.  527 g  1  . Vitamin D, Ergocalciferol, (DRISDOL) 50000 UNITS CAPS Take 50,000 Units by mouth.        Review of Systems Review of Systems    Constitutional: Negative.   Respiratory: Negative.   Cardiovascular: Negative.   Gastrointestinal: Positive for constipation and rectal pain.  Musculoskeletal: Positive for arthralgias.    Blood pressure 122/64, pulse 67, temperature 97.8 F (36.6 C), temperature source Temporal, resp. rate 18, height 5' 4.5" (1.638 m), weight 141 lb (63.957 kg).  Physical Exam Physical Exam  Constitutional: She appears well-developed and well-nourished. No distress.  HENT:  Head: Normocephalic and atraumatic.  Genitourinary:       Anorectal-moderate size external hemorrhoids right anterior, right posterior, and left lateral position. These are not significantly swollen or inflamed. No fissure. A digital rectal exam no masses are noted.  Anoscopy is performed. Moderate-sized internal hemorrhoid right posterior position but no other positions. No masses.    Data Reviewed None  Assessment    Symptomatic external hemorrhoids. I don't think she needs a hemorrhoidectomy at this time.    Plan    For the acute events I advised her to apply witch hazel to the area 4 times a day, Preparation H cream to the area four times a day, and use a warm water sitz bath.  I advised her not to use any medicated wipes after bowel movements, but use  very soft tissue paper and warm water as well as a warm water sitz bath. Return visit as needed.       Katherine Rodriguez J 08/30/2012, 5:41 PM

## 2012-10-05 ENCOUNTER — Other Ambulatory Visit: Payer: Self-pay

## 2012-12-12 ENCOUNTER — Other Ambulatory Visit: Payer: Self-pay | Admitting: Internal Medicine

## 2013-03-11 ENCOUNTER — Ambulatory Visit (INDEPENDENT_AMBULATORY_CARE_PROVIDER_SITE_OTHER): Payer: 59 | Admitting: Internal Medicine

## 2013-03-11 ENCOUNTER — Encounter: Payer: Self-pay | Admitting: Internal Medicine

## 2013-03-11 VITALS — BP 100/64 | HR 54 | Temp 98.1°F | Wt 145.0 lb

## 2013-03-11 DIAGNOSIS — R1900 Intra-abdominal and pelvic swelling, mass and lump, unspecified site: Secondary | ICD-10-CM

## 2013-03-11 DIAGNOSIS — R1033 Periumbilical pain: Secondary | ICD-10-CM

## 2013-03-11 DIAGNOSIS — R222 Localized swelling, mass and lump, trunk: Secondary | ICD-10-CM | POA: Insufficient documentation

## 2013-03-11 HISTORY — DX: Periumbilical pain: R10.33

## 2013-03-11 LAB — POCT URINALYSIS DIP (MANUAL ENTRY)
Bilirubin, UA: NEGATIVE
Blood, UA: NEGATIVE
Nitrite, UA: NEGATIVE
Spec Grav, UA: 1.01
pH, UA: 7

## 2013-03-11 NOTE — Patient Instructions (Addendum)
Sent urine for culture  But uncertain if this is the problem  Will arrange referral consult ? If this could be a small hernia.  Contact us if getting worse in the meantime if needed.

## 2013-03-11 NOTE — Progress Notes (Signed)
Chief Complaint  Patient presents with  . Naval Pain    Found last week.  Has a "pulling" sensation when she moves.  Has had some nausea.  Katherine Rodriguez at [employer]  . Nausea    HPI: Patient comes in today for SDA for  new problem evaluation.  onset last week  Periumbilical soreness andtenderness and then hurt to  Touch  4-5/10  some less this week.  Dull sensation now .  3 /10 .  No fever  Not positional  Not nocturnal  No gi changes . More frequency  Had pap and ua weeks  Ago.    ROS: See pertinent positives and negatives per HPI. Hx endometriosis   Lap in past .  Had normal gyne check recently.  Had neg colon but unable to complete neg virtual.  Hx of hememmorhoids.   Past Medical History  Diagnosis Date  . Endometriosis   . Abnormal Pap smear of anus   . PPD positive   . GERD (gastroesophageal reflux disease)   . Recurrent HSV (herpes simplex virus)   . S/P hip replacement 08/01/2011  . History of herpes labialis 08/01/2011    Family History  Problem Relation Age of Onset  . Hypertension Mother   . Arthritis Mother     spinal stenosis  . Lymphoma    . Other      bowel disease    History   Social History  . Marital Status: Married    Spouse Name: N/A    Number of Children: N/A  . Years of Education: N/A   Social History Main Topics  . Smoking status: Never Smoker   . Smokeless tobacco: None  . Alcohol Use: Yes     Comment: rare  . Drug Use: None  . Sexually Active: None   Other Topics Concern  . None   Social History Narrative   Married   Regular exercise- yes   Cutting back on caffeine   Sleep 7    HH 3 pet dog and cat.    Works hospital nursing   G4 P1     Outpatient Encounter Prescriptions as of 03/11/2013  Medication Sig Dispense Refill  . celecoxib (CELEBREX) 200 MG capsule Take 200 mg by mouth daily.      . Cholecalciferol (VITAMIN D3) 2000 UNITS TABS Take by mouth.      . naproxen sodium (ALEVE) 220 MG tablet Take 220 mg by mouth 2 (two) times  daily with a meal.        . NON FORMULARY Triflex, [employer] Product,  for her hips      . polyethylene glycol powder (GLYCOLAX/MIRALAX) powder TAKE 17 GRAMS DISOLVED IN 8 OZ OF LIQUID AS DIRECTED  119 g  0  . [DISCONTINUED] Vitamin D, Ergocalciferol, (DRISDOL) 50000 UNITS CAPS Take 50,000 Units by mouth.       No facility-administered encounter medications on file as of 03/11/2013.    EXAM:  BP 100/64  Pulse 54  Temp(Src) 98.1 F (36.7 C) (Oral)  Wt 145 lb (65.772 kg)  BMI 24.51 kg/m2  SpO2 98%  Body mass index is 24.51 kg/(m^2).  GENERAL: vitals reviewed and listed above, alert, oriented, appears well hydrated and in no acute distress HEENT: atraumatic, conjunctiva  clear, no obvious abnormalities on inspection of external nose and ears NECK: no obvious masses on inspection palpation  Abdomen:  Sof,t normal bowel sounds without hepatosplenomegaly, no guarding rebound  no CVA tenderness well healed umbi scar  About  2 cm ovoid  Mass effexct about 4-5 oclock from umbi and tender   Small non mobile.  No flank pain  MS: moves all extremities without noticeable focal  abnormality PSYCH: pleasant and cooperative, no obvious depression or anxiety  ASSESSMENT AND PLAN:  Discussed the following assessment and plan:  Periumbilical abdominal pain - doubt uti but cx pending consdier small umbi hernia . refer  options of eval discussed  - Plan: POCT urinalysis dipstick, Urine culture, Urine culture, Ambulatory referral to General Surgery  Abdominal wall lump ? - perumbilical superficial? - Plan: Ambulatory referral to General Surgery  -Patient advised to return or notify health care team  if symptoms worsen or  new concerns arise.  Patient Instructions  Sent urine for culture  But uncertain if this is the problem  Will arrange referral consult ? If this could be a small hernia.  Contact us if getting worse in the meantime if needed.     Neta Mends. Raveen Wieseler M.D.

## 2013-03-12 LAB — URINE CULTURE
Colony Count: NO GROWTH
Organism ID, Bacteria: NO GROWTH

## 2013-03-17 ENCOUNTER — Encounter: Payer: Self-pay | Admitting: Family Medicine

## 2013-03-17 ENCOUNTER — Other Ambulatory Visit: Payer: Self-pay | Admitting: Internal Medicine

## 2013-03-18 ENCOUNTER — Other Ambulatory Visit: Payer: Self-pay | Admitting: Family Medicine

## 2013-03-18 MED ORDER — POLYETHYLENE GLYCOL 3350 17 GM/SCOOP PO POWD: ORAL | Status: DC

## 2013-04-11 ENCOUNTER — Ambulatory Visit (INDEPENDENT_AMBULATORY_CARE_PROVIDER_SITE_OTHER): Payer: Commercial Managed Care - PPO | Admitting: General Surgery

## 2013-04-11 ENCOUNTER — Encounter (INDEPENDENT_AMBULATORY_CARE_PROVIDER_SITE_OTHER): Payer: Self-pay | Admitting: General Surgery

## 2013-04-11 VITALS — BP 110/88 | HR 64 | Temp 98.4°F | Resp 14 | Ht 64.5 in | Wt 142.6 lb

## 2013-04-11 DIAGNOSIS — R1033 Periumbilical pain: Secondary | ICD-10-CM

## 2013-04-11 NOTE — Patient Instructions (Signed)
If you notice increasing bulging and pain in the umbilical area, please come back and be reevaluated.

## 2013-04-11 NOTE — Progress Notes (Signed)
Patient ID: Katherine Rodriguez, female   DOB: 1956/07/21, 57 y.o.   MRN: [id number]  Chief Complaint  Patient presents with  . New Evaluation    eval UMB hernia    HPI Katherine Rodriguez is a 57 y.o. female.   HPI  She is referred by Dr. Fabian Sharp for further evaluation of umbilical pain.  She has been having intermittent problems with umbilical discomfort. She has not noticed any bulges. Dr. Fabian Sharp noticed a possible umbilical defect on examination.  No vomiting. No increased prominence with straining.  Past Medical History  Diagnosis Date  . Endometriosis   . Abnormal Pap smear of anus   . PPD positive   . GERD (gastroesophageal reflux disease)   . Recurrent HSV (herpes simplex virus)   . S/P hip replacement 08/01/2011  . History of herpes labialis 08/01/2011    Past Surgical History  Procedure Laterality Date  . Thyroidectomy, partial    . Laparotomy      exploratory    Family History  Problem Relation Age of Onset  . Hypertension Mother   . Arthritis Mother     spinal stenosis  . Lymphoma    . Other      bowel disease    Social History History  Substance Use Topics  . Smoking status: Former Smoker    Quit date: 08/21/1986  . Smokeless tobacco: Never Used  . Alcohol Use: Yes     Comment: rare    No Known Allergies  Current Outpatient Prescriptions  Medication Sig Dispense Refill  . celecoxib (CELEBREX) 200 MG capsule Take 200 mg by mouth daily.      . Cholecalciferol (VITAMIN D3) 2000 UNITS TABS Take by mouth.      . naproxen sodium (ALEVE) 220 MG tablet Take 220 mg by mouth 2 (two) times daily with a meal.        . NON FORMULARY Triflex, [employer] Product,  for her hips      . polyethylene glycol powder (GLYCOLAX/MIRALAX) powder TAKE 17 GRAMS DISOLVED IN 8 OZ OF LIQUID AS DIRECTED  119 g  5   No current facility-administered medications for this visit.    Review of Systems Review of Systems  Gastrointestinal: Positive for nausea and abdominal pain. Negative for  constipation.  Genitourinary: Negative for difficulty urinating.    Blood pressure 110/88, pulse 64, temperature 98.4 F (36.9 C), temperature source Temporal, resp. rate 14, height 5' 4.5" (1.638 m), weight 142 lb 9.6 oz (64.683 kg).  Physical Exam Physical Exam  Constitutional: She appears well-developed and well-nourished. No distress.  HENT:  Head: Normocephalic and atraumatic.  Abdominal: Soft. She exhibits no mass. There is no tenderness.  Small transverse subumbilical scar. There is a slight separation to the left of the umbilical stalk but no bulging or obvious hernia was noted at rest or with Valsalva type maneuvers.  She has a prominent umbilical stalk.    Data Reviewed Note from Dr. Fabian Sharp.  Assessment    Periumbilical abdominal pain. No abdominal wall defect/hernia palpable.     Plan    Resume usual activities. Return if she has increasing pain and a noticeable bulge.       Katherine Rodriguez J 04/11/2013, 2:39 PM

## 2013-06-11 ENCOUNTER — Other Ambulatory Visit: Payer: Self-pay | Admitting: Internal Medicine

## 2013-06-26 ENCOUNTER — Other Ambulatory Visit: Payer: Self-pay

## 2013-07-31 ENCOUNTER — Ambulatory Visit (INDEPENDENT_AMBULATORY_CARE_PROVIDER_SITE_OTHER): Payer: 59 | Admitting: Family Medicine

## 2013-07-31 ENCOUNTER — Encounter: Payer: Self-pay | Admitting: Family Medicine

## 2013-07-31 VITALS — BP 102/68 | HR 60 | Temp 99.0°F | Wt 141.0 lb

## 2013-07-31 DIAGNOSIS — J040 Acute laryngitis: Secondary | ICD-10-CM

## 2013-07-31 DIAGNOSIS — J329 Chronic sinusitis, unspecified: Secondary | ICD-10-CM

## 2013-07-31 MED ORDER — AMOXICILLIN 875 MG PO TABS: 875.0000 mg | ORAL_TABLET | Freq: Two times a day (BID) | ORAL | Status: DC

## 2013-07-31 NOTE — Patient Instructions (Signed)
INSTRUCTIONS FOR UPPER RESPIRATORY INFECTION:  -plenty of rest and fluids  -nasal saline wash 2-3 times daily (use prepackaged nasal saline or bottled/distilled water if making your own)   -can use sinex and afrin nasal spray for drainage and nasal congestion - but do NOT use longer then 3-4 days  -can use tylenol or ibuprofen as directed for aches and sorethroat  -in the winter time, using a humidifier at night is helpful (please follow cleaning instructions)  -if you are taking a cough medication - use only as directed, may also try a teaspoon of honey to coat the throat and throat lozenges  -for sore throat, salt water gargles can help  -follow up if you have fevers, facial pain, tooth pain, difficulty breathing or are worsening or not getting better in 5-7 days  

## 2013-07-31 NOTE — Progress Notes (Signed)
Chief Complaint  Patient presents with  . Cough    congestion, body aches, headache and presure, stuffy ,hoarseness    HPI:  -started: 4 days ago  -symptoms:nasal congestion, sore throat, cough, hoarseness, a little sinus pain on and off -denies:fever, SOB, NVD, tooth pain -has tried: OTC tx -sick contacts/travel/risks: denies flu exposure, tick exposure or or Ebola risks -Hx of: allergies ROS: See pertinent positives and negatives per HPI.  Past Medical History  Diagnosis Date  . Endometriosis   . Abnormal Pap smear of anus   . PPD positive   . GERD (gastroesophageal reflux disease)   . Recurrent HSV (herpes simplex virus)   . S/P hip replacement 08/01/2011  . History of herpes labialis 08/01/2011    Past Surgical History  Procedure Laterality Date  . Thyroidectomy, partial    . Laparotomy      exploratory    Family History  Problem Relation Age of Onset  . Hypertension Mother   . Arthritis Mother     spinal stenosis  . Lymphoma    . Other      bowel disease    History   Social History  . Marital Status: Married    Spouse Name: N/A    Number of Children: N/A  . Years of Education: N/A   Social History Main Topics  . Smoking status: Former Smoker    Quit date: 08/21/1986  . Smokeless tobacco: Never Used  . Alcohol Use: Yes     Comment: rare  . Drug Use: No  . Sexual Activity: None   Other Topics Concern  . None   Social History Narrative   Married   Regular exercise- yes   Cutting back on caffeine   Sleep 7    HH 3 pet dog and cat.    Works hospital nursing   G4 P1     Current outpatient prescriptions:amoxicillin (AMOXIL) 875 MG tablet, Take 1 tablet (875 mg total) by mouth 2 (two) times daily., Disp: 20 tablet, Rfl: 0;  celecoxib (CELEBREX) 200 MG capsule, Take 200 mg by mouth daily., Disp: , Rfl: ;  Cholecalciferol (VITAMIN D3) 2000 UNITS TABS, Take by mouth., Disp: , Rfl: ;  naproxen sodium (ALEVE) 220 MG tablet, Take 220 mg by mouth 2  (two) times daily with a meal.  , Disp: , Rfl:  NON FORMULARY, Triflex, [employer] Product,  for her hips, Disp: , Rfl: ;  polyethylene glycol powder (GLYCOLAX/MIRALAX) powder, TAKE 17 GRAMS DISOLVED IN 8 OZ OF LIQUID AS DIRECTED, Disp: 119 g, Rfl: 0  EXAM:  Filed Vitals:   07/31/13 1359  BP: 102/68  Pulse: 60  Temp: 99 F (37.2 C)    Body mass index is 23.84 kg/(m^2).  GENERAL: vitals reviewed and listed above, alert, oriented, appears well hydrated and in no acute distress  HEENT: atraumatic, conjunttiva clear, no obvious abnormalities on inspection of external nose and ears, normal appearance of ear canals and TMs, clear nasal congestion, mild post oropharyngeal erythema with PND, no tonsillar edema or exudate, no sinus TTP  NECK: no obvious masses on inspection  LUNGS: clear to auscultation bilaterally, no wheezes, rales or rhonchi, good air movement  CV: HRRR, no peripheral edema  MS: moves all extremities without noticeable abnormality  PSYCH: pleasant and cooperative, no obvious depression or anxiety  ASSESSMENT AND PLAN:  Discussed the following assessment and plan:  Laryngitis  Rhinosinusitis - Plan: amoxicillin (AMOXIL) 875 MG tablet  -given HPI and exam findings today, a serious  infection or illness is unlikely. We discussed potential etiologies, with VURI being most likely, and advised supportive care and monitoring. We discussed treatment side effects, likely course, antibiotic misuse, transmission, and signs of developing a serious illness. -she is heading into the holidays and incase sinus pain persists discussed risks/benefits of abx in low likelihood this is a bacterial sinusitis - abx rx given and she will shred if doe snot use -of course, we advised to return or notify a doctor immediately if symptoms worsen or new concerns arise or persist unexpectantly.    Patient Instructions  INSTRUCTIONS FOR UPPER RESPIRATORY INFECTION:  -plenty of rest and  fluids  -nasal saline wash 2-3 times daily (use prepackaged nasal saline or bottled/distilled water if making your own)   -can use sinex and afrin nasal spray for drainage and nasal congestion - but do NOT use longer then 3-4 days  -can use tylenol or ibuprofen as directed for aches and sorethroat  -in the winter time, using a humidifier at night is helpful (please follow cleaning instructions)  -if you are taking a cough medication - use only as directed, may also try a teaspoon of honey to coat the throat and throat lozenges  -for sore throat, salt water gargles can help  -follow up if you have fevers, facial pain, tooth pain, difficulty breathing or are worsening or not getting better in 5-7 days      Randall Colden R.

## 2013-07-31 NOTE — Progress Notes (Signed)
Pre visit review using our clinic review tool, if applicable. No additional management support is needed unless otherwise documented below in the visit note.,  

## 2013-09-24 ENCOUNTER — Other Ambulatory Visit: Payer: Self-pay | Admitting: Internal Medicine

## 2013-09-24 DIAGNOSIS — Z1231 Encounter for screening mammogram for malignant neoplasm of breast: Secondary | ICD-10-CM

## 2013-10-07 ENCOUNTER — Ambulatory Visit: Payer: 59 | Admitting: Internal Medicine

## 2013-10-07 ENCOUNTER — Ambulatory Visit (HOSPITAL_COMMUNITY)
Admission: RE | Admit: 2013-10-07 | Discharge: 2013-10-07 | Disposition: A | Discharge status: Home / Self Care | Payer: 59 | Source: Ambulatory Visit | Attending: Internal Medicine | Admitting: Internal Medicine

## 2013-10-07 DIAGNOSIS — Z1231 Encounter for screening mammogram for malignant neoplasm of breast: Secondary | ICD-10-CM | POA: Insufficient documentation

## 2013-10-15 ENCOUNTER — Ambulatory Visit (INDEPENDENT_AMBULATORY_CARE_PROVIDER_SITE_OTHER): Payer: 59 | Admitting: Internal Medicine

## 2013-10-15 ENCOUNTER — Encounter: Payer: Self-pay | Admitting: Internal Medicine

## 2013-10-15 VITALS — BP 100/64 | HR 68 | Temp 98.0°F | Ht 64.25 in | Wt 141.0 lb

## 2013-10-15 DIAGNOSIS — Z01818 Encounter for other preprocedural examination: Secondary | ICD-10-CM

## 2013-10-15 DIAGNOSIS — Z5181 Encounter for therapeutic drug level monitoring: Secondary | ICD-10-CM

## 2013-10-15 DIAGNOSIS — Z299 Encounter for prophylactic measures, unspecified: Secondary | ICD-10-CM

## 2013-10-15 DIAGNOSIS — M1610 Unilateral primary osteoarthritis, unspecified hip: Secondary | ICD-10-CM

## 2013-10-15 LAB — BASIC METABOLIC PANEL
BUN: 15 mg/dL (ref 6–23)
CHLORIDE: 103 meq/L (ref 96–112)
CO2: 29 mEq/L (ref 19–32)
Calcium: 9.5 mg/dL (ref 8.4–10.5)
Creatinine, Ser: 0.6 mg/dL (ref 0.4–1.2)
GFR: 106.61 mL/min (ref >60.00)
Glucose, Bld: 87 mg/dL (ref 70–99)
Potassium: 4.8 mEq/L (ref 3.5–5.1)
Sodium: 139 mEq/L (ref 135–145)

## 2013-10-15 LAB — HEPATIC FUNCTION PANEL
ALT: 26 U/L (ref 0–35)
AST: 22 U/L (ref 0–37)
Albumin: 4.1 g/dL (ref 3.5–5.2)
Alkaline Phosphatase: 65 U/L (ref 39–117)
BILIRUBIN DIRECT: 0 mg/dL (ref 0.0–0.3)
BILIRUBIN TOTAL: 0.6 mg/dL (ref 0.3–1.2)
TOTAL PROTEIN: 7.2 g/dL (ref 6.0–8.3)

## 2013-10-15 LAB — CBC WITH DIFFERENTIAL/PLATELET
BASOS PCT: 0.5 % (ref 0.0–3.0)
Basophils Absolute: 0 10*3/uL (ref 0.0–0.1)
EOS PCT: 0.8 % (ref 0.0–5.0)
Eosinophils Absolute: 0.1 10*3/uL (ref 0.0–0.7)
HCT: 40.9 % (ref 36.0–46.0)
Hemoglobin: 13.1 g/dL (ref 12.0–15.0)
Lymphocytes Relative: 30.9 % (ref 12.0–46.0)
Lymphs Abs: 2 10*3/uL (ref 0.7–4.0)
MCHC: 32.1 g/dL (ref 30.0–36.0)
MCV: 93.9 fl (ref 78.0–100.0)
MONOS PCT: 8.5 % (ref 3.0–12.0)
Monocytes Absolute: 0.5 10*3/uL (ref 0.1–1.0)
NEUTROS PCT: 59.3 % (ref 43.0–77.0)
Neutro Abs: 3.8 10*3/uL (ref 1.4–7.7)
Platelets: 242 10*3/uL (ref 150.0–400.0)
RBC: 4.35 Mil/uL (ref 3.87–5.11)
RDW: 12.7 % (ref 11.5–14.6)
WBC: 6.4 10*3/uL (ref 4.5–10.5)

## 2013-10-15 LAB — LIPID PANEL
CHOL/HDL RATIO: 2
Cholesterol: 170 mg/dL (ref 0–200)
HDL: 68.8 mg/dL (ref >39.00)
LDL CALC: 87 mg/dL (ref 0–99)
Triglycerides: 72 mg/dL (ref 0.0–149.0)
VLDL: 14.4 mg/dL (ref 0.0–40.0)

## 2013-10-15 LAB — TSH: TSH: 0.59 u[IU]/mL (ref 0.35–5.50)

## 2013-10-15 NOTE — Patient Instructions (Signed)
No  Contraindications to surgery .  Will notify you  of labs when available. Will send note   To Alusio  About the knee surgery.

## 2013-10-15 NOTE — Progress Notes (Signed)
Chief Complaint  Patient presents with  . Medical Clearance    Hip Surgery    HPI: Katherine Rodriguez    comes in today for pre op evaluation to [facility] left THIPA. Dr Maureen Ralphs. New major changes in her health history. She's had this previous surgery on her other hip. Surgery scheduled for March 18 she is on Celebrex for pain to use anterolateral approach. Pain and disability is becoming more problematic. Remotes hx of nexium  None recnetly No hx of bleeding and clotting . No active cardiac or pulmonary disease coronary artery disease or neurologic disease. No sleep apnea.  ROS:  GEN/ HEENT: No fever, significant weight changes sweats headaches vision problems hearing changes, CV/ PULM; No chest pain shortness of breath cough, syncope,edema  change in exercise tolerance. GI /GU: No adominal pain, vomiting, change in bowel habits. No blood in the stool. No significant GU symptoms. SKIN/HEME: ,no acute skin rashes suspicious lesions or bleeding. No lymphadenopathy, nodules, masses.  NEURO/ PSYCH:  No neurologic signs such as weakness numbness. No depression anxiety. IMM/ Allergy: No unusual infections.  Allergy .   REST of 12 system review negative except as per HPI Patient states she is up-to-date on health care maintenance.   Past Medical History  Diagnosis Date  . Endometriosis   . Abnormal Pap smear of anus   . PPD positive   . GERD (gastroesophageal reflux disease)   . Recurrent HSV (herpes simplex virus)   . S/P hip replacement 08/01/2011  . History of herpes labialis 08/01/2011    Family History  Problem Relation Age of Onset  . Hypertension Mother   . Arthritis Mother     spinal stenosis  . Lymphoma    . Other      bowel disease    History   Social History  . Marital Status: Married    Spouse Name: N/A    Number of Children: N/A  . Years of Education: N/A   Social History Main Topics  . Smoking status: Former Smoker    Quit date: 08/21/1986  . Smokeless  tobacco: Never Used  . Alcohol Use: Yes     Comment: rare  . Drug Use: No  . Sexual Activity: None   Other Topics Concern  . None   Social History Narrative   Married   Regular exercise- yes   Cutting back on caffeine   Sleep 7    HH 3 pet dog and cat.    Works hospital nursing   G4 P1     Outpatient Encounter Prescriptions as of 10/15/2013  Medication Sig  . acetaminophen (TYLENOL) 500 MG tablet Take 500 mg by mouth every 6 (six) hours as needed.  . celecoxib (CELEBREX) 200 MG capsule Take 200 mg by mouth daily.  . Cholecalciferol (VITAMIN D3) 2000 UNITS TABS Take by mouth.  . polyethylene glycol powder (GLYCOLAX/MIRALAX) powder TAKE 17 GRAMS DISOLVED IN 8 OZ OF LIQUID AS DIRECTED  . Probiotic Product (PROBIOTIC DAILY PO) Take by mouth.  . naproxen sodium (ALEVE) 220 MG tablet Take 220 mg by mouth 2 (two) times daily with a meal.    . [DISCONTINUED] amoxicillin (AMOXIL) 875 MG tablet Take 1 tablet (875 mg total) by mouth 2 (two) times daily.  . [DISCONTINUED] NON FORMULARY Triflex, [place] Product,  for her hips    EXAM:  BP 100/64  Pulse 68  Temp(Src) 98 F (36.7 C) (Oral)  Ht 5' 4.25" (1.632 m)  Wt 141 lb (63.957 kg)  BMI 24.01 kg/m2  SpO2 98%  Body mass index is 24.01 kg/(m^2). Physical Exam: Vital signs reviewed QHU:TMLY is a well-developed well-nourished alert cooperative  female who appears her stated age in no acute distress.  HEENT: normocephalic atraumatic , Eyes: PERRL EOM's full, conjunctiva clear, Nares: paten,t no deformity discharge or tenderness., Ears: no deformity EAC's clear TMs with normal landmarks. Mouth: clear OP, no lesions, edema.  Moist mucous membranes. Dentition in adequate repair. NECK: supple without masses, thyromegaly or bruits. CHEST/PULM:  Clear to auscultation and percussion breath sounds equal no wheeze , rales or rhonchi. No chest wall deformities or tenderness. CV: PMI is nondisplaced, S1 S2 no gallops, murmurs, rubs. Peripheral pulses  are full without delay.No JVD .  ABDOMEN: Bowel sounds normal nontender  No guard or rebound, no hepato splenomegal no CVA tenderness.   Extremtities:  No clubbing cyanosis or edema, no acute joint swelling or redness no focal atrophy walks with a llimp  NEURO:  Oriented x3, cranial nerves 3-12 appear to be intact, no obvious focal weakness, no abnormal reflexes  SKIN: No acute rashes normal turgor, color, no bruising or petechiae. PSYCH: Oriented, good eye contact, no obvious depression anxiety, cognition and judgment appear normal. LN: no cervical  adenopathy   Lab Results  Component Value Date   WBC 6.4 10/15/2013   HGB 13.1 10/15/2013   HCT 40.9 10/15/2013   PLT 242.0 10/15/2013   GLUCOSE 87 10/15/2013   CHOL 170 10/15/2013   TRIG 72.0 10/15/2013   HDL 68.80 10/15/2013   LDLCALC 87 10/15/2013   ALT 26 10/15/2013   AST 22 10/15/2013   NA 139 10/15/2013   K 4.8 10/15/2013   CL 103 10/15/2013   CREATININE 0.6 10/15/2013   BUN 15 10/15/2013   CO2 29 10/15/2013   TSH 0.59 10/15/2013   INR 2.4* 05/22/2009    ASSESSMENT AND PLAN:  Discussed the following assessment and plan:  Hip arthritis - Plan: Basic metabolic panel, CBC with Differential, Hepatic function panel, Lipid panel, TSH  Medication monitoring encounter - check renal and lft cbc on daily cox 2 inhibitors  - Plan: Basic metabolic panel, CBC with Differential, Hepatic function panel, Lipid panel, TSH  Preventive measure - Plan: Basic metabolic panel, CBC with Differential, Hepatic function panel, Lipid panel, TSH  Pre-op examination - no ci to surgery low risk patient - Plan: Basic metabolic panel, CBC with Differential, Hepatic function panel, Lipid panel, TSH No contraindication to surgery lab work done today patient was concerned because she had anemia before her previous surgery. But no active bleeding or alarm features today. -Patient advised to return or notify health care team  if symptoms worsen or persist or new concerns  arise.  Patient Instructions  No  Contraindications to surgery .  Will notify you  of labs when available. Will send note   To Alusio  About the knee surgery.   [name]. Ludy Messamore M.D.  Pre visit review using our clinic review tool, if applicable. No additional management support is needed unless otherwise documented below in the visit note.

## 2013-10-16 DIAGNOSIS — Z5181 Encounter for therapeutic drug level monitoring: Secondary | ICD-10-CM | POA: Insufficient documentation

## 2013-10-21 ENCOUNTER — Encounter: Payer: Self-pay | Admitting: Family Medicine

## 2013-10-21 ENCOUNTER — Other Ambulatory Visit: Payer: Self-pay | Admitting: Orthopedic Surgery

## 2013-10-24 ENCOUNTER — Encounter (HOSPITAL_COMMUNITY): Payer: Self-pay | Admitting: Pharmacy Technician

## 2013-10-27 ENCOUNTER — Other Ambulatory Visit (HOSPITAL_COMMUNITY): Payer: Self-pay | Admitting: Orthopedic Surgery

## 2013-10-27 NOTE — Patient Instructions (Addendum)
[place]  10/27/2013   Your procedure is scheduled on: Wednesday March 18th  Report to [facility] at 630  AM.  Call this number if you have problems the morning of surgery [phone]   Remember:  Do not eat food or drink liquids :After Midnight.                                  SEE [facility] PREPARING FOR SURGERY SHEET             You may not have any metal on your body including hair pins and piercings  Do not wear jewelry, make-up.  Do not wear lotions, powders, or perfumes. No  Deodorant is to be worn.   Men may shave face and neck.  Do not bring valuables to the hospital. [place].  Contacts, dentures or bridgework may not be worn into surgery.  Leave suitcase in the car. After surgery it may be brought to your room.  For patients admitted to the hospital, checkout time is 11:00 AM the day of discharge.    Please read over the following fact sheets that you were given: [facility] Preparing for surgery sheet,  incentive spirometer sheet, blood fact sheet, MRSA information  Call Paulette Blanch  RN pre op nurse if needed [phone]    [place].  PATIENT SIGNATURE___________________________________________  NURSE SIGNATURE_____________________________________________

## 2013-10-28 ENCOUNTER — Encounter (HOSPITAL_COMMUNITY): Payer: Self-pay

## 2013-10-28 ENCOUNTER — Ambulatory Visit (HOSPITAL_COMMUNITY)
Admission: RE | Admit: 2013-10-28 | Discharge: 2013-10-28 | Disposition: A | Discharge status: Home / Self Care | Payer: 59 | Source: Ambulatory Visit | Attending: Orthopedic Surgery | Admitting: Orthopedic Surgery

## 2013-10-28 ENCOUNTER — Encounter (HOSPITAL_COMMUNITY)
Admission: RE | Admit: 2013-10-28 | Discharge: 2013-10-28 | Disposition: A | Discharge status: Home / Self Care | Payer: 59 | Source: Ambulatory Visit | Attending: Orthopedic Surgery | Admitting: Orthopedic Surgery

## 2013-10-28 DIAGNOSIS — Z01812 Encounter for preprocedural laboratory examination: Secondary | ICD-10-CM | POA: Insufficient documentation

## 2013-10-28 DIAGNOSIS — M1610 Unilateral primary osteoarthritis, unspecified hip: Secondary | ICD-10-CM | POA: Insufficient documentation

## 2013-10-28 DIAGNOSIS — M169 Osteoarthritis of hip, unspecified: Secondary | ICD-10-CM | POA: Insufficient documentation

## 2013-10-28 DIAGNOSIS — Z01818 Encounter for other preprocedural examination: Secondary | ICD-10-CM | POA: Insufficient documentation

## 2013-10-28 HISTORY — DX: Personal history of other medical treatment: Z92.89

## 2013-10-28 HISTORY — DX: Pneumonia, unspecified organism: J18.9

## 2013-10-28 HISTORY — DX: Anemia, unspecified: D64.9

## 2013-10-28 HISTORY — DX: Unspecified osteoarthritis, unspecified site: M19.90

## 2013-10-28 LAB — CBC
HCT: 40.4 % (ref 36.0–46.0)
Hemoglobin: 13.4 g/dL (ref 12.0–15.0)
MCH: 30.4 pg (ref 26.0–34.0)
MCHC: 33.2 g/dL (ref 30.0–36.0)
MCV: 91.6 fL (ref 78.0–100.0)
Platelets: 228 10*3/uL (ref 150–400)
RBC: 4.41 MIL/uL (ref 3.87–5.11)
RDW: 12.1 % (ref 11.5–15.5)
WBC: 5 10*3/uL (ref 4.0–10.5)

## 2013-10-28 LAB — URINALYSIS, ROUTINE W REFLEX MICROSCOPIC
BILIRUBIN URINE: NEGATIVE
Glucose, UA: NEGATIVE mg/dL
Hgb urine dipstick: NEGATIVE
Ketones, ur: NEGATIVE mg/dL
Leukocytes, UA: NEGATIVE
Nitrite: NEGATIVE
Protein, ur: NEGATIVE mg/dL
Specific Gravity, Urine: 1.006 (ref 1.005–1.030)
UROBILINOGEN UA: 0.2 mg/dL (ref 0.0–1.0)
pH: 7.5 (ref 5.0–8.0)

## 2013-10-28 LAB — COMPREHENSIVE METABOLIC PANEL
ALBUMIN: 3.8 g/dL (ref 3.5–5.2)
ALT: 24 U/L (ref 0–35)
AST: 20 U/L (ref 0–37)
Alkaline Phosphatase: 70 U/L (ref 39–117)
BUN: 10 mg/dL (ref 6–23)
CO2: 30 mEq/L (ref 19–32)
CREATININE: 0.66 mg/dL (ref 0.50–1.10)
Calcium: 9.6 mg/dL (ref 8.4–10.5)
Chloride: 101 mEq/L (ref 96–112)
GFR calc Af Amer: >90 mL/min (ref >90)
GFR calc non Af Amer: >90 mL/min (ref >90)
Glucose, Bld: 53 mg/dL — ABNORMAL LOW (ref 70–99)
Potassium: 4.3 mEq/L (ref 3.7–5.3)
Sodium: 140 mEq/L (ref 137–147)
TOTAL PROTEIN: 7.1 g/dL (ref 6.0–8.3)
Total Bilirubin: 0.3 mg/dL (ref 0.3–1.2)

## 2013-10-28 LAB — APTT: APTT: 29 s (ref 24–37)

## 2013-10-28 LAB — SURGICAL PCR SCREEN
MRSA, PCR: NEGATIVE
Staphylococcus aureus: NEGATIVE

## 2013-10-28 LAB — PROTIME-INR
INR: 0.95 (ref 0.00–1.49)
PROTHROMBIN TIME: 12.5 s (ref 11.6–15.2)

## 2013-11-04 ENCOUNTER — Other Ambulatory Visit: Payer: Self-pay | Admitting: Orthopedic Surgery

## 2013-11-04 NOTE — H&P (Signed)
Katherine Rodriguez DOB: Jan 12, 1956 Married / Language: English / Race: White Female  Date of Admission:  11/05/2013  Chief Complaint:  Left Hip Pain  History of Present Illness The patient is a 58 year old female who comes in for a preoperative History and Physical. The patient is scheduled for a left total hip arthroplasty (anterior approach) to be performed by Dr. Dione Plover. Aluisio, MD at [facility] on 11-05-2013. The patient is a 58 year old female who presents for follow up of their hip. The patient is being followed for their left hip pain. Symptoms reported today include: pain. The patient feels that they are doing 25 percent better and report their pain level to be moderate to severe. The following medication has been used for pain control: antiinflammatory medication and Tylenol. The patient had recently underwent an IA hip injection. Myrth states the left hip is getting progressively worse. The injection helped just a small amount. She said the left hip is now worse than the right hip was prior to when we replaced that. It is a different kind of pain, but it is hurting badly and is causing more functional issues. She is now ready to get the other hip fixed. They have been treated conservatively in the past for the above stated problem and despite conservative measures, they continue to have progressive pain and severe functional limitations and dysfunction. They have failed non-operative management including home exercise, medications. It is felt that they would benefit from undergoing total joint replacement. Risks and benefits of the procedure have been discussed with the patient and they elect to proceed with surgery. There are no active contraindications to surgery such as ongoing infection or rapidly progressive neurological disease.   Allergies No Known Drug Allergies   Problem List/Past Medical Osteoarthritis, Hip (715.35) Hallux valgus (735.0) Gastroesophageal  Reflux Disease Osteoarthritis  Family History Osteoarthritis. Mother. mother    Social History Exercise. Exercises weekly; does running / walking Pain Contract. no Illicit drug use. no Tobacco use. Former smoker. former smoker; smoke(d) 1/2 pack(s) per day Drug/Alcohol Rehab (Previously). no Tobacco / smoke exposure. no Current work status. working full time Children. 1 Number of flights of stairs before winded. greater than 5 Marital status. married Alcohol use. current drinker; drinks wine; only occasionally per week Drug/Alcohol Rehab (Currently). no Advance Directives. Living Will, Healthcare POA    Medication History CeleBREX (200MG  Capsule, 1 (one) Capsule Oral daily, Taken starting 01/03/2013) Active. Vitamin D3 (50000UNIT Capsule, Oral) Active. Probiotic ( Oral) Active.   Past Surgical History Thyroidectomy; Subtotal Total Hip Replacement. right   Review of Systems General:Not Present- Chills, Fever, Night Sweats, Fatigue, Weight Gain, Weight Loss and Memory Loss. Skin:Not Present- Hives, Itching, Rash, Eczema and Lesions. HEENT:Not Present- Tinnitus, Headache, Double Vision, Visual Loss, Hearing Loss and Dentures. Respiratory:Not Present- Shortness of breath with exertion, Shortness of breath at rest, Allergies, Coughing up blood and Chronic Cough. Cardiovascular:Not Present- Chest Pain, Racing/skipping heartbeats, Difficulty Breathing Lying Down, Murmur, Swelling and Palpitations. Gastrointestinal:Not Present- Bloody Stool, Heartburn, Abdominal Pain, Vomiting, Nausea, Constipation, Diarrhea, Difficulty Swallowing, Jaundice and Loss of appetitie. Female Genitourinary:Present- Urinary frequency. Not Present- Blood in Urine, Weak urinary stream, Discharge, Flank Pain, Incontinence, Painful Urination, Urgency, Urinary Retention and Urinating at Night. Musculoskeletal:Present- Joint Pain. Not Present- Muscle Weakness, Muscle Pain, Joint  Swelling, Back Pain, Morning Stiffness and Spasms. Neurological:Not Present- Tremor, Dizziness, Blackout spells, Paralysis, Difficulty with balance and Weakness. Psychiatric:Not Present- Insomnia.    Vitals Weight: 141 lb Height: 64.5  in Weight was reported by patient. Height was reported by patient. Body Surface Area: 1.71 m Body Mass Index: 23.83 kg/m Pulse: 60 (Regular) Resp.: 16 (Unlabored) BP: 118/68 (Sitting, Left Arm, Standard)     Physical Exam The physical exam findings are as follows:   General Mental Status - Alert, cooperative and good historian. General Appearance- pleasant. Not in acute distress. Orientation- Oriented X3. Build & Nutrition- Well nourished and Well developed.   Head and Neck Head- normocephalic, atraumatic . Neck Global Assessment- supple. no bruit auscultated on the right and no bruit auscultated on the left. Note: previous anterior thyroid surgical scar noted.   Eye Vision- Wears corrective lenses. Pupil- Bilateral- Regular and Round. Motion- Bilateral- EOMI.   Chest and Lung Exam Auscultation: Breath sounds:- clear at anterior chest wall and - clear at posterior chest wall. Adventitious sounds:- No Adventitious sounds.   Cardiovascular Auscultation:Rhythm- Regular rate and rhythm. Heart Sounds- S1 WNL and S2 WNL. Murmurs & Other Heart Sounds:Auscultation of the heart reveals - No Murmurs.   Abdomen Palpation/Percussion:Tenderness- Abdomen is non-tender to palpation. Rigidity (guarding)- Abdomen is soft. Auscultation:Auscultation of the abdomen reveals - Bowel sounds normal.   Female Genitourinary   Note: Not done, not pertinent to present illness  Musculoskeletal   Note: On exam she is alert and oriented in no apparent distress. Left hip can be flexed to about 100 degrees, minimal internal rotation, about 20 degrees of external rotation, and 20 degrees of  abduction.  RADIOGRAPHS Set of radiographs shows that she is bone on bone in the left hip with subchondral cystic formation.   Assessment & Plan Osteoarthritis, Hip (715.35) Impression: Left Hip  Note: Plan is for a Left Total Hip Replacement - Anterior Approach by Dr. Aluisio.  Plan is to go home.  PCP - Dr. Wanda Panosh  The patient does not have any contraindications and will receive TXA (tranexamic acid) prior to surgery.  Signed electronically by Canuto Kingston L Nakaiya Beddow, III PA-C 

## 2013-11-05 ENCOUNTER — Inpatient Hospital Stay (HOSPITAL_COMMUNITY): Payer: 59

## 2013-11-05 ENCOUNTER — Inpatient Hospital Stay (HOSPITAL_COMMUNITY)
Admission: RE | Admit: 2013-11-05 | Discharge: 2013-11-07 | DRG: 470 | Disposition: A | Discharge status: Home Health | Payer: 59 | Source: Ambulatory Visit | Attending: Orthopedic Surgery | Admitting: Orthopedic Surgery

## 2013-11-05 ENCOUNTER — Encounter (HOSPITAL_COMMUNITY): Payer: Self-pay | Admitting: *Deleted

## 2013-11-05 ENCOUNTER — Encounter (HOSPITAL_COMMUNITY)
Admission: RE | Disposition: A | Discharge status: Home Health | Payer: Self-pay | Source: Ambulatory Visit | Attending: Orthopedic Surgery

## 2013-11-05 ENCOUNTER — Encounter (HOSPITAL_COMMUNITY): Payer: 59 | Admitting: Anesthesiology

## 2013-11-05 ENCOUNTER — Inpatient Hospital Stay (HOSPITAL_COMMUNITY): Payer: 59 | Admitting: Anesthesiology

## 2013-11-05 DIAGNOSIS — Z96649 Presence of unspecified artificial hip joint: Secondary | ICD-10-CM

## 2013-11-05 DIAGNOSIS — IMO0002 Reserved for concepts with insufficient information to code with codable children: Secondary | ICD-10-CM

## 2013-11-05 DIAGNOSIS — M1610 Unilateral primary osteoarthritis, unspecified hip: Principal | ICD-10-CM | POA: Diagnosis present

## 2013-11-05 DIAGNOSIS — M169 Osteoarthritis of hip, unspecified: Secondary | ICD-10-CM | POA: Diagnosis present

## 2013-11-05 DIAGNOSIS — R112 Nausea with vomiting, unspecified: Secondary | ICD-10-CM | POA: Diagnosis not present

## 2013-11-05 HISTORY — PX: TOTAL HIP ARTHROPLASTY: SHX124

## 2013-11-05 LAB — TYPE AND SCREEN
ABO/RH(D): A POS
ANTIBODY SCREEN: NEGATIVE

## 2013-11-05 SURGERY — ARTHROPLASTY, HIP, TOTAL, ANTERIOR APPROACH
Anesthesia: Spinal | Site: Hip | Laterality: Left

## 2013-11-05 MED ORDER — OXYCODONE HCL 5 MG PO TABS: 5.0000 mg | ORAL_TABLET | Freq: Once | ORAL | Status: DC | PRN

## 2013-11-05 MED ORDER — CEFAZOLIN SODIUM-DEXTROSE 2-3 GM-% IV SOLR
2.0000 g | INTRAVENOUS | Status: AC
Start: 2013-11-05 — End: 2013-11-05
  Administered 2013-11-05: 2 g via INTRAVENOUS

## 2013-11-05 MED ORDER — MIDAZOLAM HCL 5 MG/5ML IJ SOLN
INTRAMUSCULAR | Status: DC | PRN
  Administered 2013-11-05: 2 mg via INTRAVENOUS

## 2013-11-05 MED ORDER — LACTATED RINGERS IV SOLN
INTRAVENOUS | Status: DC | PRN
  Administered 2013-11-05 (×3): via INTRAVENOUS

## 2013-11-05 MED ORDER — DIPHENHYDRAMINE HCL 12.5 MG/5ML PO ELIX: 12.5000 mg | ORAL_SOLUTION | ORAL | Status: DC | PRN

## 2013-11-05 MED ORDER — HYDROMORPHONE HCL PF 1 MG/ML IJ SOLN: 0.2500 mg | INTRAMUSCULAR | Status: DC | PRN

## 2013-11-05 MED ORDER — EPHEDRINE SULFATE 50 MG/ML IJ SOLN
INTRAMUSCULAR | Status: DC | PRN
  Administered 2013-11-05: 10 mg via INTRAVENOUS
  Administered 2013-11-05: 5 mg via INTRAVENOUS
  Administered 2013-11-05: 10 mg via INTRAVENOUS

## 2013-11-05 MED ORDER — ACETAMINOPHEN 10 MG/ML IV SOLN
1000.0000 mg | Freq: Once | INTRAVENOUS | Status: DC
  Filled 2013-11-05: qty 100

## 2013-11-05 MED ORDER — TRAMADOL HCL 50 MG PO TABS
50.0000 mg | ORAL_TABLET | Freq: Four times a day (QID) | ORAL | Status: DC | PRN
  Administered 2013-11-06 – 2013-11-07 (×3): 50 mg via ORAL
  Filled 2013-11-05 (×3): qty 1

## 2013-11-05 MED ORDER — SODIUM CHLORIDE 0.9 % IV SOLN: INTRAVENOUS | Status: DC

## 2013-11-05 MED ORDER — ONDANSETRON HCL 4 MG/2ML IJ SOLN: 4.0000 mg | Freq: Four times a day (QID) | INTRAMUSCULAR | Status: DC | PRN

## 2013-11-05 MED ORDER — BUPIVACAINE HCL (PF) 0.5 % IJ SOLN
INTRAMUSCULAR | Status: AC
  Filled 2013-11-05: qty 30

## 2013-11-05 MED ORDER — MIDAZOLAM HCL 2 MG/2ML IJ SOLN
INTRAMUSCULAR | Status: AC
  Filled 2013-11-05: qty 2

## 2013-11-05 MED ORDER — PROPOFOL 10 MG/ML IV BOLUS
INTRAVENOUS | Status: AC
  Filled 2013-11-05: qty 20

## 2013-11-05 MED ORDER — SODIUM CHLORIDE 0.9 % IJ SOLN
INTRAMUSCULAR | Status: AC
  Filled 2013-11-05: qty 10

## 2013-11-05 MED ORDER — LIDOCAINE HCL (CARDIAC) 20 MG/ML IV SOLN
INTRAVENOUS | Status: AC
  Filled 2013-11-05: qty 5

## 2013-11-05 MED ORDER — PROPOFOL INFUSION 10 MG/ML OPTIME
INTRAVENOUS | Status: DC | PRN
  Administered 2013-11-05: 100 ug/kg/min via INTRAVENOUS

## 2013-11-05 MED ORDER — BUPIVACAINE HCL (PF) 0.25 % IJ SOLN
INTRAMUSCULAR | Status: DC | PRN
  Administered 2013-11-05: 20 mL

## 2013-11-05 MED ORDER — ONDANSETRON HCL 4 MG/2ML IJ SOLN
INTRAMUSCULAR | Status: AC
  Filled 2013-11-05: qty 2

## 2013-11-05 MED ORDER — ONDANSETRON HCL 4 MG PO TABS: 4.0000 mg | ORAL_TABLET | Freq: Four times a day (QID) | ORAL | Status: DC | PRN

## 2013-11-05 MED ORDER — FENTANYL CITRATE 0.05 MG/ML IJ SOLN
INTRAMUSCULAR | Status: AC
  Filled 2013-11-05: qty 2

## 2013-11-05 MED ORDER — MORPHINE SULFATE 2 MG/ML IJ SOLN
1.0000 mg | INTRAMUSCULAR | Status: DC | PRN
  Administered 2013-11-05: 2 mg via INTRAVENOUS
  Filled 2013-11-05: qty 1

## 2013-11-05 MED ORDER — FLEET ENEMA 7-19 GM/118ML RE ENEM: 1.0000 | ENEMA | Freq: Once | RECTAL | Status: AC | PRN

## 2013-11-05 MED ORDER — OXYCODONE HCL 5 MG PO TABS
5.0000 mg | ORAL_TABLET | ORAL | Status: DC | PRN
  Administered 2013-11-05 – 2013-11-06 (×5): 10 mg via ORAL
  Filled 2013-11-05 (×5): qty 2

## 2013-11-05 MED ORDER — STERILE WATER FOR IRRIGATION IR SOLN
Status: DC | PRN
  Administered 2013-11-05: 1500 mL

## 2013-11-05 MED ORDER — METHOCARBAMOL 500 MG PO TABS: 500.0000 mg | ORAL_TABLET | Freq: Four times a day (QID) | ORAL | Status: DC | PRN

## 2013-11-05 MED ORDER — SODIUM CHLORIDE 0.9 % IJ SOLN
INTRAMUSCULAR | Status: AC
  Filled 2013-11-05: qty 50

## 2013-11-05 MED ORDER — DEXAMETHASONE 6 MG PO TABS
10.0000 mg | ORAL_TABLET | Freq: Every day | ORAL | Status: AC
  Administered 2013-11-06: 10 mg via ORAL
  Filled 2013-11-05: qty 1

## 2013-11-05 MED ORDER — BUPIVACAINE LIPOSOME 1.3 % IJ SUSP
20.0000 mL | Freq: Once | INTRAMUSCULAR | Status: DC
  Filled 2013-11-05: qty 20

## 2013-11-05 MED ORDER — DEXAMETHASONE SODIUM PHOSPHATE 10 MG/ML IJ SOLN
10.0000 mg | Freq: Every day | INTRAMUSCULAR | Status: AC
  Filled 2013-11-05: qty 1

## 2013-11-05 MED ORDER — RIVAROXABAN 10 MG PO TABS
10.0000 mg | ORAL_TABLET | Freq: Every day | ORAL | Status: DC
  Administered 2013-11-06 – 2013-11-07 (×2): 10 mg via ORAL
  Filled 2013-11-05 (×3): qty 1

## 2013-11-05 MED ORDER — ACETAMINOPHEN 650 MG RE SUPP: 650.0000 mg | Freq: Four times a day (QID) | RECTAL | Status: DC | PRN

## 2013-11-05 MED ORDER — BUPIVACAINE HCL (PF) 0.25 % IJ SOLN
INTRAMUSCULAR | Status: AC
  Filled 2013-11-05: qty 30

## 2013-11-05 MED ORDER — ACETAMINOPHEN 325 MG PO TABS
650.0000 mg | ORAL_TABLET | Freq: Four times a day (QID) | ORAL | Status: DC | PRN
  Administered 2013-11-06: 650 mg via ORAL
  Filled 2013-11-05: qty 2

## 2013-11-05 MED ORDER — DEXTROSE-NACL 5-0.9 % IV SOLN
INTRAVENOUS | Status: DC
  Administered 2013-11-05 (×2): via INTRAVENOUS

## 2013-11-05 MED ORDER — DEXTROSE 5 % IV SOLN
500.0000 mg | Freq: Four times a day (QID) | INTRAVENOUS | Status: DC | PRN
  Administered 2013-11-05 (×2): 500 mg via INTRAVENOUS
  Filled 2013-11-05 (×2): qty 5

## 2013-11-05 MED ORDER — ACETAMINOPHEN 500 MG PO TABS
1000.0000 mg | ORAL_TABLET | Freq: Four times a day (QID) | ORAL | Status: AC
  Administered 2013-11-05 – 2013-11-06 (×4): 1000 mg via ORAL
  Filled 2013-11-05 (×5): qty 2

## 2013-11-05 MED ORDER — SENNOSIDES-DOCUSATE SODIUM 8.6-50 MG PO TABS: 1.0000 | ORAL_TABLET | Freq: Every evening | ORAL | Status: DC | PRN

## 2013-11-05 MED ORDER — CEFAZOLIN SODIUM-DEXTROSE 2-3 GM-% IV SOLR
INTRAVENOUS | Status: AC
  Filled 2013-11-05: qty 50

## 2013-11-05 MED ORDER — CEFAZOLIN SODIUM 1-5 GM-% IV SOLN
1.0000 g | Freq: Four times a day (QID) | INTRAVENOUS | Status: AC
  Administered 2013-11-05 (×2): 1 g via INTRAVENOUS
  Filled 2013-11-05 (×2): qty 50

## 2013-11-05 MED ORDER — PROMETHAZINE HCL 25 MG/ML IJ SOLN: 6.2500 mg | INTRAMUSCULAR | Status: DC | PRN

## 2013-11-05 MED ORDER — FENTANYL CITRATE 0.05 MG/ML IJ SOLN
INTRAMUSCULAR | Status: DC | PRN
  Administered 2013-11-05: 100 ug via INTRAVENOUS

## 2013-11-05 MED ORDER — BISACODYL 10 MG RE SUPP: 10.0000 mg | Freq: Every day | RECTAL | Status: DC | PRN

## 2013-11-05 MED ORDER — DOCUSATE SODIUM 100 MG PO CAPS
100.0000 mg | ORAL_CAPSULE | Freq: Two times a day (BID) | ORAL | Status: DC
  Administered 2013-11-05 – 2013-11-07 (×5): 100 mg via ORAL

## 2013-11-05 MED ORDER — LACTATED RINGERS IV SOLN: INTRAVENOUS | Status: DC

## 2013-11-05 MED ORDER — ONDANSETRON HCL 4 MG/2ML IJ SOLN
INTRAMUSCULAR | Status: DC | PRN
  Administered 2013-11-05: 4 mg via INTRAVENOUS

## 2013-11-05 MED ORDER — MENTHOL 3 MG MT LOZG: 1.0000 | LOZENGE | OROMUCOSAL | Status: DC | PRN

## 2013-11-05 MED ORDER — METOCLOPRAMIDE HCL 5 MG/ML IJ SOLN: 5.0000 mg | Freq: Three times a day (TID) | INTRAMUSCULAR | Status: DC | PRN

## 2013-11-05 MED ORDER — ACETAMINOPHEN 10 MG/ML IV SOLN
INTRAVENOUS | Status: DC | PRN
  Administered 2013-11-05: 1000 mg via INTRAVENOUS

## 2013-11-05 MED ORDER — KETOROLAC TROMETHAMINE 15 MG/ML IJ SOLN: 7.5000 mg | Freq: Four times a day (QID) | INTRAMUSCULAR | Status: AC | PRN

## 2013-11-05 MED ORDER — DEXAMETHASONE SODIUM PHOSPHATE 10 MG/ML IJ SOLN
10.0000 mg | Freq: Once | INTRAMUSCULAR | Status: AC
  Administered 2013-11-05: 10 mg via INTRAVENOUS

## 2013-11-05 MED ORDER — EPHEDRINE SULFATE 50 MG/ML IJ SOLN
INTRAMUSCULAR | Status: AC
  Filled 2013-11-05: qty 1

## 2013-11-05 MED ORDER — METOCLOPRAMIDE HCL 10 MG PO TABS: 5.0000 mg | ORAL_TABLET | Freq: Three times a day (TID) | ORAL | Status: DC | PRN

## 2013-11-05 MED ORDER — SODIUM CHLORIDE 0.9 % IJ SOLN
INTRAMUSCULAR | Status: DC | PRN
  Administered 2013-11-05: 30 mL

## 2013-11-05 MED ORDER — BUPIVACAINE HCL (PF) 0.5 % IJ SOLN
INTRAMUSCULAR | Status: DC | PRN
  Administered 2013-11-05: 3 mL

## 2013-11-05 MED ORDER — PROPOFOL 10 MG/ML IV BOLUS
INTRAVENOUS | Status: AC
Start: 2013-11-05 — End: 2013-11-05
  Filled 2013-11-05: qty 20

## 2013-11-05 MED ORDER — POLYETHYLENE GLYCOL 3350 17 G PO PACK
17.0000 g | PACK | Freq: Every day | ORAL | Status: DC
  Administered 2013-11-05 – 2013-11-07 (×3): 17 g via ORAL

## 2013-11-05 MED ORDER — TRANEXAMIC ACID 100 MG/ML IV SOLN
1000.0000 mg | INTRAVENOUS | Status: AC
  Administered 2013-11-05: 1000 mg via INTRAVENOUS
  Filled 2013-11-05: qty 10

## 2013-11-05 MED ORDER — 0.9 % SODIUM CHLORIDE (POUR BTL) OPTIME
TOPICAL | Status: DC | PRN
  Administered 2013-11-05: 1000 mL

## 2013-11-05 MED ORDER — MEPERIDINE HCL 50 MG/ML IJ SOLN: 6.2500 mg | INTRAMUSCULAR | Status: DC | PRN

## 2013-11-05 MED ORDER — PHENOL 1.4 % MT LIQD: 1.0000 | OROMUCOSAL | Status: DC | PRN

## 2013-11-05 MED ORDER — DEXAMETHASONE SODIUM PHOSPHATE 10 MG/ML IJ SOLN
INTRAMUSCULAR | Status: AC
  Filled 2013-11-05: qty 1

## 2013-11-05 MED ORDER — OXYCODONE HCL 5 MG/5ML PO SOLN
5.0000 mg | Freq: Once | ORAL | Status: DC | PRN
  Filled 2013-11-05: qty 5

## 2013-11-05 MED ORDER — BUPIVACAINE LIPOSOME 1.3 % IJ SUSP
INTRAMUSCULAR | Status: DC | PRN
  Administered 2013-11-05: 20 mL

## 2013-11-05 SURGICAL SUPPLY — 43 items
BAG SPEC THK2 15X12 ZIP CLS (MISCELLANEOUS)
BAG ZIPLOCK 12X15 (MISCELLANEOUS) IMPLANT
BLADE SAW SGTL 18X1.27X75 (BLADE) ×2 IMPLANT
BLADE SAW SGTL 18X1.27X75MM (BLADE) ×1
CAPT HIP PF COP ×2 IMPLANT
CLOSURE WOUND 1/2 X4 (GAUZE/BANDAGES/DRESSINGS) ×1
DECANTER SPIKE VIAL GLASS SM (MISCELLANEOUS) ×3 IMPLANT
DRAPE C-ARM 42X120 X-RAY (DRAPES) ×3 IMPLANT
DRAPE STERI IOBAN 125X83 (DRAPES) ×3 IMPLANT
DRAPE U-SHAPE 47X51 STRL (DRAPES) ×9 IMPLANT
DRSG ADAPTIC 3X8 NADH LF (GAUZE/BANDAGES/DRESSINGS) ×3 IMPLANT
DRSG AQUACEL AG ADV 3.5X10 (GAUZE/BANDAGES/DRESSINGS) ×2 IMPLANT
DRSG MEPILEX BORDER 4X4 (GAUZE/BANDAGES/DRESSINGS) ×3 IMPLANT
DRSG MEPILEX BORDER 4X8 (GAUZE/BANDAGES/DRESSINGS) ×3 IMPLANT
DURAPREP 26ML APPLICATOR (WOUND CARE) ×3 IMPLANT
ELECT BLADE 6.5 EXT (BLADE) ×3 IMPLANT
ELECT REM PT RETURN 9FT ADLT (ELECTROSURGICAL) ×3
ELECTRODE REM PT RTRN 9FT ADLT (ELECTROSURGICAL) ×1 IMPLANT
EVACUATOR 1/8 PVC DRAIN (DRAIN) ×3 IMPLANT
FACESHIELD LNG OPTICON STERILE (SAFETY) ×12 IMPLANT
GLOVE BIO SURGEON STRL SZ7.5 (GLOVE) ×3 IMPLANT
GLOVE BIO SURGEON STRL SZ8 (GLOVE) ×6 IMPLANT
GLOVE BIOGEL PI IND STRL 8 (GLOVE) ×2 IMPLANT
GLOVE BIOGEL PI INDICATOR 8 (GLOVE) ×4
GOWN STRL REUS W/TWL LRG LVL3 (GOWN DISPOSABLE) ×3 IMPLANT
GOWN STRL REUS W/TWL XL LVL3 (GOWN DISPOSABLE) ×3 IMPLANT
KIT BASIN OR (CUSTOM PROCEDURE TRAY) ×3 IMPLANT
NDL SAFETY ECLIPSE 18X1.5 (NEEDLE) ×2 IMPLANT
NEEDLE HYPO 18GX1.5 SHARP (NEEDLE) ×6
PACK TOTAL JOINT (CUSTOM PROCEDURE TRAY) ×3 IMPLANT
PADDING CAST COTTON 6X4 STRL (CAST SUPPLIES) ×3 IMPLANT
SPONGE GAUZE 4X4 12PLY (GAUZE/BANDAGES/DRESSINGS) ×2 IMPLANT
STRIP CLOSURE SKIN 1/2X4 (GAUZE/BANDAGES/DRESSINGS) ×2 IMPLANT
SUCTION FRAZIER 12FR DISP (SUCTIONS) IMPLANT
SUT ETHIBOND NAB CT1 #1 30IN (SUTURE) ×3 IMPLANT
SUT MNCRL AB 4-0 PS2 18 (SUTURE) ×3 IMPLANT
SUT VIC AB 2-0 CT1 27 (SUTURE) ×6
SUT VIC AB 2-0 CT1 TAPERPNT 27 (SUTURE) ×2 IMPLANT
SUT VLOC 180 0 24IN GS25 (SUTURE) ×3 IMPLANT
SYR 20CC LL (SYRINGE) ×3 IMPLANT
SYR 50ML LL SCALE MARK (SYRINGE) ×3 IMPLANT
TOWEL OR 17X26 10 PK STRL BLUE (TOWEL DISPOSABLE) ×3 IMPLANT
TRAY FOLEY CATH 14FRSI W/METER (CATHETERS) ×3 IMPLANT

## 2013-11-05 NOTE — Anesthesia Procedure Notes (Signed)
Spinal  Patient location during procedure: OR End time: 11/05/2013 8:34 AM Staffing CRNA/Resident: WILLIFORD, PEGGY Performed by: anesthesiologist and resident/CRNA  Preanesthetic Checklist Completed: patient identified, site marked, surgical consent, pre-op evaluation, timeout performed, IV checked, risks and benefits discussed and monitors and equipment checked Spinal Block Patient position: sitting Prep: Betadine Patient monitoring: heart rate, continuous pulse ox and blood pressure Approach: midline Location: L2-3 Injection technique: single-shot Needle Needle type: Sprotte  Needle gauge: 24 G Needle length: 9 cm Assessment Sensory level: T6 Additional Notes Expiration date of kit checked and confirmed. Patient tolerated procedure well, without complications.     

## 2013-11-05 NOTE — H&P (View-Only) (Signed)
Katherine Rodriguez DOB: Jan 12, 1956 Married / Language: English / Race: White Female  Date of Admission:  11/05/2013  Chief Complaint:  Left Hip Pain  History of Present Illness The patient is a 58 year old female who comes in for a preoperative History and Physical. The patient is scheduled for a left total hip arthroplasty (anterior approach) to be performed by Dr. Dione Plover. Aluisio, MD at [facility] on 11-05-2013. The patient is a 58 year old female who presents for follow up of their hip. The patient is being followed for their left hip pain. Symptoms reported today include: pain. The patient feels that they are doing 25 percent better and report their pain level to be moderate to severe. The following medication has been used for pain control: antiinflammatory medication and Tylenol. The patient had recently underwent an IA hip injection. Katherine Rodriguez states the left hip is getting progressively worse. The injection helped just a small amount. She said the left hip is now worse than the right hip was prior to when we replaced that. It is a different kind of pain, but it is hurting badly and is causing more functional issues. She is now ready to get the other hip fixed. They have been treated conservatively in the past for the above stated problem and despite conservative measures, they continue to have progressive pain and severe functional limitations and dysfunction. They have failed non-operative management including home exercise, medications. It is felt that they would benefit from undergoing total joint replacement. Risks and benefits of the procedure have been discussed with the patient and they elect to proceed with surgery. There are no active contraindications to surgery such as ongoing infection or rapidly progressive neurological disease.   Allergies No Known Drug Allergies   Problem List/Past Medical Osteoarthritis, Hip (715.35) Hallux valgus (735.0) Gastroesophageal  Reflux Disease Osteoarthritis  Family History Osteoarthritis. Mother. mother    Social History Exercise. Exercises weekly; does running / walking Pain Contract. no Illicit drug use. no Tobacco use. Former smoker. former smoker; smoke(d) 1/2 pack(s) per day Drug/Alcohol Rehab (Previously). no Tobacco / smoke exposure. no Current work status. working full time Children. 1 Number of flights of stairs before winded. greater than 5 Marital status. married Alcohol use. current drinker; drinks wine; only occasionally per week Drug/Alcohol Rehab (Currently). no Advance Directives. Living Will, Healthcare POA    Medication History CeleBREX (200MG  Capsule, 1 (one) Capsule Oral daily, Taken starting 01/03/2013) Active. Vitamin D3 (50000UNIT Capsule, Oral) Active. Probiotic ( Oral) Active.   Past Surgical History Thyroidectomy; Subtotal Total Hip Replacement. right   Review of Systems General:Not Present- Chills, Fever, Night Sweats, Fatigue, Weight Gain, Weight Loss and Memory Loss. Skin:Not Present- Hives, Itching, Rash, Eczema and Lesions. HEENT:Not Present- Tinnitus, Headache, Double Vision, Visual Loss, Hearing Loss and Dentures. Respiratory:Not Present- Shortness of breath with exertion, Shortness of breath at rest, Allergies, Coughing up blood and Chronic Cough. Cardiovascular:Not Present- Chest Pain, Racing/skipping heartbeats, Difficulty Breathing Lying Down, Murmur, Swelling and Palpitations. Gastrointestinal:Not Present- Bloody Stool, Heartburn, Abdominal Pain, Vomiting, Nausea, Constipation, Diarrhea, Difficulty Swallowing, Jaundice and Loss of appetitie. Female Genitourinary:Present- Urinary frequency. Not Present- Blood in Urine, Weak urinary stream, Discharge, Flank Pain, Incontinence, Painful Urination, Urgency, Urinary Retention and Urinating at Night. Musculoskeletal:Present- Joint Pain. Not Present- Muscle Weakness, Muscle Pain, Joint  Swelling, Back Pain, Morning Stiffness and Spasms. Neurological:Not Present- Tremor, Dizziness, Blackout spells, Paralysis, Difficulty with balance and Weakness. Psychiatric:Not Present- Insomnia.    Vitals Weight: 141 lb Height: 64.5  in Weight was reported by patient. Height was reported by patient. Body Surface Area: 1.71 m Body Mass Index: 23.83 kg/m Pulse: 60 (Regular) Resp.: 16 (Unlabored) BP: 118/68 (Sitting, Left Arm, Standard)     Physical Exam The physical exam findings are as follows:   General Mental Status - Alert, cooperative and good historian. General Appearance- pleasant. Not in acute distress. Orientation- Oriented X3. Build & Nutrition- Well nourished and Well developed.   Head and Neck Head- normocephalic, atraumatic . Neck Global Assessment- supple. no bruit auscultated on the right and no bruit auscultated on the left. Note: previous anterior thyroid surgical scar noted.   Eye Vision- Wears corrective lenses. Pupil- Bilateral- Regular and Round. Motion- Bilateral- EOMI.   Chest and Lung Exam Auscultation: Breath sounds:- clear at anterior chest wall and - clear at posterior chest wall. Adventitious sounds:- No Adventitious sounds.   Cardiovascular Auscultation:Rhythm- Regular rate and rhythm. Heart Sounds- S1 WNL and S2 WNL. Murmurs & Other Heart Sounds:Auscultation of the heart reveals - No Murmurs.   Abdomen Palpation/Percussion:Tenderness- Abdomen is non-tender to palpation. Rigidity (guarding)- Abdomen is soft. Auscultation:Auscultation of the abdomen reveals - Bowel sounds normal.   Female Genitourinary   Note: Not done, not pertinent to present illness  Musculoskeletal   Note: On exam she is alert and oriented in no apparent distress. Left hip can be flexed to about 100 degrees, minimal internal rotation, about 20 degrees of external rotation, and 20 degrees of  abduction.  RADIOGRAPHS Set of radiographs shows that she is bone on bone in the left hip with subchondral cystic formation.   Assessment & Plan Osteoarthritis, Hip (715.35) Impression: Left Hip  Note: Plan is for a Left Total Hip Replacement - Anterior Approach by Dr. Wynelle Link.  Plan is to go home.  PCP - Dr. Shanon Ace  The patient does not have any contraindications and will receive TXA (tranexamic acid) prior to surgery.  Signed electronically by Joelene Millin, III PA-C

## 2013-11-05 NOTE — Transfer of Care (Signed)
Immediate Anesthesia Transfer of Care Note  Patient: Katherine Rodriguez  Procedure(s) Performed: Procedure(s): LEFT TOTAL HIP ARTHROPLASTY ANTERIOR APPROACH (Left)  Patient Location: PACU  Anesthesia Type:Regional  Level of Consciousness: awake, alert  and oriented  Airway & Oxygen Therapy: Patient Spontanous Breathing and Patient connected to face mask oxygen  Post-op Assessment: Report given to PACU RN and Post -op Vital signs reviewed and stable  Post vital signs: Reviewed and stable  Complications: No apparent anesthesia complications

## 2013-11-05 NOTE — Interval H&P Note (Signed)
History and Physical Interval Note:  11/05/2013 7:58 AM  Katherine Rodriguez  has presented today for surgery, with the diagnosis of osteoarthritis of the left hip  The various methods of treatment have been discussed with the patient and family. After consideration of risks, benefits and other options for treatment, the patient has consented to  Procedure(s): LEFT TOTAL HIP ARTHROPLASTY ANTERIOR APPROACH (Left) as a surgical intervention .  The patient's history has been reviewed, patient examined, no change in status, stable for surgery.  I have reviewed the patient's chart and labs.  Questions were answered to the patient's satisfaction.     Gearlean Alf

## 2013-11-05 NOTE — Anesthesia Preprocedure Evaluation (Addendum)
Anesthesia Evaluation  Patient identified by MRN, date of birth, ID band Patient awake    Reviewed: Allergy & Precautions, H&P , NPO status , Patient's Chart, lab work & pertinent test results  Airway Mallampati: II TM Distance: >3 FB     Dental  (+) Dental Advisory Given   Pulmonary pneumonia -, resolved, former smoker,          Cardiovascular negative cardio ROS  Rhythm:Regular Rate:Normal     Neuro/Psych negative neurological ROS  negative psych ROS   GI/Hepatic Neg liver ROS, GERD-  ,  Endo/Other  negative endocrine ROS  Renal/GU negative Renal ROS     Musculoskeletal negative musculoskeletal ROS (+)   Abdominal   Peds  Hematology negative hematology ROS (+) anemia ,   Anesthesia Other Findings   Reproductive/Obstetrics negative OB ROS                          Anesthesia Physical Anesthesia Plan  ASA: II  Anesthesia Plan:    Post-op Pain Management:    Induction:   Airway Management Planned:   Additional Equipment:   Intra-op Plan:   Post-operative Plan:   Informed Consent: I have reviewed the patients History and Physical, chart, labs and discussed the procedure including the risks, benefits and alternatives for the proposed anesthesia with the patient or authorized representative who has indicated his/her understanding and acceptance.   Dental advisory given  Plan Discussed with: CRNA  Anesthesia Plan Comments:         Anesthesia Quick Evaluation

## 2013-11-05 NOTE — Evaluation (Signed)
Physical Therapy Evaluation Patient Details Name: Katherine Rodriguez MRN: [id number] DOB: March 23, 1956 Today's Date: 11/05/2013 Time: 3295-1884 PT Time Calculation (min): 27 min  PT Assessment / Plan / Recommendation History of Present Illness  s/p DA  L THA  Clinical Impression  Pt will benefit from PT to address deficits below; Will need to practice stairs prior to d/c--has approximately 1 flight at home to enter; she is familiar with technique using rail and a crutch    PT Assessment  Patient needs continued PT services    Follow Up Recommendations  Home health PT;No PT follow up (vs)    Does the patient have the potential to tolerate intense rehabilitation      Barriers to Discharge        Equipment Recommendations  None recommended by PT (husband getting walker)    Recommendations for Other Services     Frequency 7X/week    Precautions / Restrictions Precautions Precautions: None Precaution Comments: have reviewed DA hip and no precautions with regard specifically to hip motion--pt had posterior THA ~ 55yrs ago Restrictions Weight Bearing Restrictions: No Other Position/Activity Restrictions: WBAT   Pertinent Vitals/Pain sats 97-100% HR 73      Mobility  Bed Mobility Overal bed mobility: Needs Assistance Bed Mobility: Sit to Supine Sit to supine: Supervision General bed mobility comments: for safety Transfers Overall transfer level: Needs assistance Equipment used: Rolling walker (2 wheeled) Transfers: Sit to/from Stand Sit to Stand: Min guard General transfer comment: cues for hand placement Ambulation/Gait Ambulation/Gait assistance: Min guard Ambulation Distance (Feet): 80 Feet Assistive device: Rolling walker (2 wheeled) Gait Pattern/deviations: Step-to pattern;Decreased weight shift to left General Gait Details: cues for sequence,RW position    Exercises Total Joint Exercises Ankle Circles/Pumps: AROM;Both;5 reps Quad Sets: 5 reps;AROM;Both   PT  Diagnosis: Difficulty walking  PT Problem List: Decreased activity tolerance;Decreased mobility;Decreased knowledge of use of DME;Decreased range of motion PT Treatment Interventions: DME instruction;Gait training;Stair training;Functional mobility training;Therapeutic activities;Therapeutic exercise;Patient/family education     PT Goals(Current goals can be found in the care plan section) Acute Rehab PT Goals PT Goal Formulation: With patient Time For Goal Achievement: 11/07/13 Potential to Achieve Goals: Good  Visit Information  Last PT Received On: 11/05/13 Assistance Needed: +1 History of Present Illness: s/p DA  L THA       Prior Functioning  Home Living Family/patient expects to be discharged to:: Private residence Living Arrangements: Spouse/significant other;Children Type of Home: House Home Access: Stairs to enter Technical brewer of Steps: 12  Entrance Stairs-Rails: [place]: One level Home Equipment: Crutches;Walker - 2 wheels Additional Comments: gave her walker away Prior Function Level of Independence: Independent Communication Communication: No difficulties    Cognition  Cognition Arousal/Alertness: Awake/alert Behavior During Therapy: WFL for tasks assessed/performed Overall Cognitive Status: Within Functional Limits for tasks assessed    Extremity/Trunk Assessment Upper Extremity Assessment Upper Extremity Assessment: Overall WFL for tasks assessed Lower Extremity Assessment Lower Extremity Assessment: LLE deficits/detail LLE Deficits / Details: limited hip flexion due to pain   Balance    End of Session PT - End of Session Equipment Utilized During Treatment: Gait belt Activity Tolerance: Patient tolerated treatment well Patient left: in chair;with call bell/phone within reach Nurse Communication: Mobility status  GP     [facility] 11/05/2013, 4:10 PM

## 2013-11-05 NOTE — Op Note (Signed)
OPERATIVE REPORT  PREOPERATIVE DIAGNOSIS: Osteoarthritis of the Left hip.   POSTOPERATIVE DIAGNOSIS: Osteoarthritis of the Left  hip.   PROCEDURE: Left total hip arthroplasty, anterior approach.   SURGEON: Gaynelle Arabian, MD   ASSISTANT: Arlee Muslim, PA-C  ANESTHESIA:  Spinal  ESTIMATED BLOOD LOSS:- 500 ml    DRAINS: Hemovac x1.   COMPLICATIONS: None   CONDITION: PACU - hemodynamically stable.   BRIEF CLINICAL NOTE: Katherine Rodriguez is a 58 y.o. female who has advanced end-  stage arthritis of his Left  hip with progressively worsening pain and  dysfunction.The patient has failed nonoperative management and presents for  total hip arthroplasty.   PROCEDURE IN DETAIL: After successful administration of spinal  anesthetic, the traction boots for the [facility] bed were placed on both  feet and the patient was placed onto the [facility] bed, boots placed into the leg  holders. The Left hip was then isolated from the perineum with plastic  drapes and prepped and draped in the usual sterile fashion. ASIS and  greater trochanter were marked and a oblique incision was made, starting  at about 1 cm lateral and 2 cm distal to the ASIS and coursing towards  the anterior cortex of the femur. The skin was cut with a 10 blade  through subcutaneous tissue to the level of the fascia overlying the  tensor fascia lata muscle. The fascia was then incised in line with the  incision at the junction of the anterior third and posterior 2/3rd. The  muscle was teased off the fascia and then the interval between the TFL  and the rectus was developed. The Hohmann retractor was then placed at  the top of the femoral neck over the capsule. The vessels overlying the  capsule were cauterized and the fat on top of the capsule was removed.  A Hohmann retractor was then placed anterior underneath the rectus  femoris to give exposure to the entire anterior capsule. A T-shaped  capsulotomy was performed. The  edges were tagged and the femoral head  was identified.       Osteophytes are removed off the superior acetabulum.  The femoral neck was then cut in situ with an oscillating saw. Traction  was then applied to the left lower extremity utilizing the [facility]  traction. The femoral head was then removed. Retractors were placed  around the acetabulum and then circumferential removal of the labrum was  performed. Osteophytes were also removed. Reaming starts at 45 mm to  medialize and  Increased in 2 mm increments to 51 mm. We reamed in  approximately 40 degrees of abduction, 20 degrees anteversion. A 52 mm  pinnacle acetabular shell was then impacted in anatomic position under  fluoroscopic guidance with excellent purchase. We did not need to place  any additional dome screws. A 32 mm neutral + 4 marathon liner was then  placed into the acetabular shell.       The femoral lift was then placed along the lateral aspect of the femur  just distal to the vastus ridge. The leg was  externally rotated and capsule  was stripped off the inferior aspect of the femoral neck down to the  level of the lesser trochanter, this was done with electrocautery. The femur was lifted after this was performed. The  leg was then placed and extended in adducted position to essentially delivering the femur. We also removed the capsule superiorly and the  piriformis from  the piriformis fossa to gain excellent exposure of the  proximal femur. Rongeur was used to remove some cancellous bone to get  into the lateral portion of the proximal femur for placement of the  initial starter reamer. The starter broaches was placed  the starter broach  and was shown to go down the center of the canal. Broaching  with the  Corail system was then performed starting at size 8, coursing  Up to size 13. A size 13 had excellent torsional and rotational  and axial stability. The trial standard offset neck was then placed  with a 32 + 5 trial  head. The hip was then reduced. We confirmed that  the stem was in the canal both on AP and lateral x-rays. It also has excellent sizing. The hip was reduced with outstanding stability through full extension, full external rotation,  and then flexion in adduction internal rotation. AP pelvis was taken  and the leg lengths were measured and found to be exactly equal. Hip  was then dislocated again and the femoral head and neck removed. The  femoral broach was removed. Size 13 Corail stem with a standard offset  neck was then impacted into the femur following native anteversion. Has  excellent purchase in the canal. Excellent torsional and rotational and  axial stability. It is confirmed to be in the canal on AP and lateral  fluoroscopic views. The 32 + 5 ceramic head was placed and the hip  reduced with outstanding stability. Again AP pelvis was taken and it  confirmed that the leg lengths were equal. The wound was then copiously  irrigated with saline solution and the capsule reattached and repaired  with Ethibond suture.  20 mL of Exparel mixed with 50 mL of saline then additional 20 ml of .25% Bupivicaine injected into the capsule and into the edge of the tensor fascia lata as well as subcutaneous tissue. The fascia overlying the tensor fascia lata was  then closed with a running #1 V-Loc. Subcu was closed with interrupted  2-0 Vicryl and subcuticular running 4-0 Monocryl. Incision was cleaned  and dried. Steri-Strips and a bulky sterile dressing applied. Hemovac  drain was hooked to suction and then he was awakened and transported to  recovery in stable condition.        Please note that a surgical assistant was a medical necessity for this procedure to perform it in a safe and expeditious manner. Assistant was necessary to provide appropriate retraction of vital neurovascular structures and to prevent femoral fracture and allow for anatomic placement of the prosthesis.  Gaynelle Arabian, M.D.

## 2013-11-05 NOTE — Anesthesia Postprocedure Evaluation (Signed)
Anesthesia Post Note  Patient: Katherine Rodriguez  Procedure(s) Performed: Procedure(s) (LRB): LEFT TOTAL HIP ARTHROPLASTY ANTERIOR APPROACH (Left)  Anesthesia type: Spinal  Patient location: PACU  Post pain: Pain level controlled  Post assessment: Post-op Vital signs reviewed  Last Vitals: BP 106/61  Pulse 48  Temp(Src) 36.3 C (Oral)  Resp 17  SpO2 100%  Post vital signs: Reviewed  Level of consciousness: sedated  Complications: No apparent anesthesia complications

## 2013-11-06 LAB — BASIC METABOLIC PANEL
BUN: 8 mg/dL (ref 6–23)
CO2: 29 mEq/L (ref 19–32)
Calcium: 8.2 mg/dL — ABNORMAL LOW (ref 8.4–10.5)
Chloride: 104 mEq/L (ref 96–112)
Creatinine, Ser: 0.63 mg/dL (ref 0.50–1.10)
GFR calc non Af Amer: >90 mL/min (ref >90)
GLUCOSE: 163 mg/dL — AB (ref 70–99)
POTASSIUM: 4.2 meq/L (ref 3.7–5.3)
Sodium: 140 mEq/L (ref 137–147)

## 2013-11-06 LAB — CBC
HEMATOCRIT: 32 % — AB (ref 36.0–46.0)
HEMOGLOBIN: 10.6 g/dL — AB (ref 12.0–15.0)
MCH: 30 pg (ref 26.0–34.0)
MCHC: 33.1 g/dL (ref 30.0–36.0)
MCV: 90.7 fL (ref 78.0–100.0)
Platelets: 164 10*3/uL (ref 150–400)
RBC: 3.53 MIL/uL — AB (ref 3.87–5.11)
RDW: 12.1 % (ref 11.5–15.5)
WBC: 12 10*3/uL — ABNORMAL HIGH (ref 4.0–10.5)

## 2013-11-06 MED ORDER — SODIUM CHLORIDE 0.9 % IV SOLN
INTRAVENOUS | Status: DC
  Administered 2013-11-06: 10:00:00 via INTRAVENOUS

## 2013-11-06 MED ORDER — ONDANSETRON HCL 4 MG PO TABS: 4.0000 mg | ORAL_TABLET | Freq: Four times a day (QID) | ORAL | Status: DC | PRN

## 2013-11-06 MED ORDER — SODIUM CHLORIDE 0.9 % IV BOLUS (SEPSIS)
500.0000 mL | Freq: Once | INTRAVENOUS | Status: AC
  Administered 2013-11-06: 500 mL via INTRAVENOUS

## 2013-11-06 MED ORDER — METHOCARBAMOL 500 MG PO TABS: 500.0000 mg | ORAL_TABLET | Freq: Four times a day (QID) | ORAL | Status: DC | PRN

## 2013-11-06 MED ORDER — TRAMADOL HCL 50 MG PO TABS: 50.0000 mg | ORAL_TABLET | Freq: Four times a day (QID) | ORAL | Status: DC | PRN

## 2013-11-06 MED ORDER — SODIUM CHLORIDE 0.9 % IV SOLN
INTRAVENOUS | Status: DC
  Administered 2013-11-07: 07:00:00 via INTRAVENOUS

## 2013-11-06 MED ORDER — OXYCODONE HCL 5 MG PO TABS: 5.0000 mg | ORAL_TABLET | ORAL | Status: DC | PRN

## 2013-11-06 MED ORDER — RIVAROXABAN 10 MG PO TABS: 10.0000 mg | ORAL_TABLET | Freq: Every day | ORAL | Status: DC

## 2013-11-06 NOTE — Progress Notes (Signed)
Physical Therapy Treatment Patient Details Name: Katherine Rodriguez MRN: [id number] DOB: [birth year] Today's Date: 11/06/2013 Time: 1445-1510 PT Time Calculation (min): 25 min  PT Assessment / Plan / Recommendation  History of Present Illness s/p DA  L THA   PT Comments   POD # 1 pm session.  Pt was in bed resting and needed to use BR.  Assisted pt OOB to BR then amb in hallway.  Pt "feeling better" but not enough to tackle 12 steps to D/C to home.   Pt plans to stay one more night.  Follow Up Recommendations  Home health PT;No PT follow up     Does the patient have the potential to tolerate intense rehabilitation     Barriers to Discharge        Equipment Recommendations  None recommended by PT    Recommendations for Other Services    Frequency 7X/week   Progress towards PT Goals Progress towards PT goals: Progressing toward goals  Plan      Precautions / Restrictions Precautions Precautions: None Restrictions Weight Bearing Restrictions: No Other Position/Activity Restrictions: WBAT   Pertinent Vitals/Pain     Mobility  Bed Mobility Overal bed mobility: Needs Assistance Bed Mobility: Supine to Sit;Sit to Supine Supine to sit: Min guard Sit to supine: Min guard General bed mobility comments: min guard assist and increased time Transfers Overall transfer level: Needs assistance Equipment used: Rolling walker (2 wheeled) Transfers: Sit to/from Stand Sit to Stand: Min guard General transfer comment: assisted OOB to BR with increased time Ambulation/Gait Ambulation/Gait assistance: Min guard Ambulation Distance (Feet): 55 Feet Assistive device: Rolling walker (2 wheeled) Gait Pattern/deviations: Step-to pattern Gait velocity: decreased General Gait Details: amb in hallway limited distance with no c/o dizziness.  "I feel better but still not 100%"    PT Goals (current goals can now be found in the care plan section)    Visit Information  Last PT Received On:  11/06/13 Assistance Needed: +1 History of Present Illness: s/p DA  L THA    Subjective Data      Cognition       Balance     End of Session PT - End of Session Equipment Utilized During Treatment: Gait belt Activity Tolerance: Patient tolerated treatment well Patient left: in bed   Rica Koyanagi  PTA [facility]  Acute  Rehab Pager      [phone]

## 2013-11-06 NOTE — Progress Notes (Addendum)
Subjective: 1 Day Post-Op Procedure(s) (LRB): LEFT TOTAL HIP ARTHROPLASTY ANTERIOR APPROACH (Left) Patient reports pain as mild.   Patient seen in rounds with Dr. Wynelle Link.  She is doing very well this morning.   Patient is well, and has had no acute complaints or problems Patient is ready to go home today.  Objective: Vital signs in last 24 hours: Temp:  [94.3 F (34.6 C)-99.5 F (37.5 C)] 98.7 F (37.1 C) (03/19 0615) Pulse Rate:  [45-88] 74 (03/19 0615) Resp:  [12-17] 16 (03/19 0615) BP: (89-116)/(52-68) 96/58 mmHg (03/19 0615) SpO2:  [98 %-100 %] 100 % (03/19 0615) Weight:  [63.988 kg (141 lb 1.1 oz)] 63.988 kg (141 lb 1.1 oz) (03/18 1225)  Intake/Output from previous day:  Intake/Output Summary (Last 24 hours) at 11/06/13 0823 Last data filed at 11/06/13 0815  Gross per 24 hour  Intake   6010 ml  Output   6445 ml  Net   -435 ml    Intake/Output this shift: Total I/O In: 240 [P.O.:240] Out: 150 [Urine:150]  Labs:  Recent Labs  11/06/13 0435  HGB 10.6*    Recent Labs  11/06/13 0435  WBC 12.0*  RBC 3.53*  HCT 32.0*  PLT 164    Recent Labs  11/06/13 0435  NA 140  K 4.2  CL 104  CO2 29  BUN 8  CREATININE 0.63  GLUCOSE 163*  CALCIUM 8.2*   No results found for this basename: LABPT, INR,  in the last 72 hours  EXAM: General - Patient is Alert, Appropriate and Oriented Extremity - Neurovascular intact Sensation intact distally Incision - clean, dry, no drainage Motor Function - intact, moving foot and toes well on exam.   Assessment/Plan: 1 Day Post-Op Procedure(s) (LRB): LEFT TOTAL HIP ARTHROPLASTY ANTERIOR APPROACH (Left) Procedure(s) (LRB): LEFT TOTAL HIP ARTHROPLASTY ANTERIOR APPROACH (Left) Past Medical History  Diagnosis Date  . Endometriosis   . Abnormal Pap smear of anus   . PPD positive   . Recurrent HSV (herpes simplex virus)   . S/P hip replacement 08/01/2011  . History of herpes labialis 08/01/2011  . Arthritis   . GERD  (gastroesophageal reflux disease) 2012    hx of, none recent  . Pneumonia 1994    hx of  . Anemia 2010    hx of  . History of blood transfusion 2010    no reaction   Principal Problem:   OA (osteoarthritis) of hip  Estimated body mass index is 23.85 kg/(m^2) as calculated from the following:   Height as of this encounter: 5' 4.5" (1.638 m).   Weight as of this encounter: 63.988 kg (141 lb 1.1 oz). Advance diet Up with therapy Discharge home with home health Diet - Regular diet Follow up - in 2 weeks Activity - WBAT Disposition - Home Condition Upon Discharge - Good D/C Meds - See DC Summary DVT Prophylaxis - Xarelto  Illeana Edick 11/06/2013, 8:23 AM   Addendum Note - Mrs. Bega was working with therapy in the stairwell and became nauseated and apparently vagaled.  Cecille Rubin, who was working with her, checked her BP and was notef tobe low at 80's/40's.  Notified Kim her nurse who will give her a 500 cc NS bolus now and then recheck her pressure. Her chart shows that she is a little negative on her fluids.  Will give fluids this morning and keep her here for now.  The patient still desires to go home but understands that she needs to be  ambulating and without symptoms.  Monitor this morning and will see how she does today.

## 2013-11-06 NOTE — Progress Notes (Signed)
OT Cancellation Note  Patient Details Name: Katherine Rodriguez MRN: [id number] DOB: 1956-02-11   Cancelled Treatment:    Reason Eval/Treat Not Completed: Other (comment).  Pt had posterior approach THA 2 years ago.  She does not feel she will need OT.  She does have a normal height commode at home and feels like she will be OK.  Let her know that our commodes are comfort height,  17 1/2".   [name] 11/06/2013, 8:39 AM Lesle Chris, OTR/L [phone] 11/06/2013

## 2013-11-06 NOTE — Progress Notes (Signed)
Physical Therapy Treatment Patient Details Name: Katherine Rodriguez MRN: [id number] DOB: Dec 19, 1955 Today's Date: 11/06/2013 Time: 8099-8338 PT Time Calculation (min): 25 min  PT Assessment / Plan / Recommendation  History of Present Illness s/p DA  L THA   PT Comments   POD # 1 pt finished 3/4 bag bolus so assisted out of straight back chair to descend 10 steps with spouse and PA at [place] due to near syncope episode.  Assited to recliner and returned to room.    Follow Up Recommendations  Home health PT;No PT follow up     Does the patient have the potential to tolerate intense rehabilitation     Barriers to Discharge        Equipment Recommendations  None recommended by PT    Recommendations for Other Services    Frequency 7X/week   Progress towards PT Goals Progress towards PT goals: Progressing toward goals  Plan      Precautions / Restrictions Precautions Precautions: None Restrictions Weight Bearing Restrictions: No Other Position/Activity Restrictions: WBAT   Pertinent Vitals/Pain     Mobility  Bed Mobility General bed mobility comments: Pt OOB in recliner Transfers Overall transfer level: Needs assistance Equipment used: Rolling walker (2 wheeled) Transfers: Sit to/from Stand Sit to Stand: Min assist General transfer comment: Min asssit to stand as percautionary due to hypotension Ambulation/Gait Ambulation Distance (Feet): 5 Feet General Gait Details: only amb from recliner to w/c as Tx session focused on flight steps pt has to enter her home (12 steps) Stairs: Yes Stairs assistance: Min assist Stair Management: One rail Left;Forwards;With crutches Number of Stairs: 10 General stair comments: decend 10 steps Min assist with spouse and PA    PT Goals (current goals can now be found in the care plan section)    Visit Information  Last PT Received On: 11/06/13 Assistance Needed: +1 History of Present Illness: s/p DA  L THA    Subjective Data       Cognition       Balance     End of Session PT - End of Session Equipment Utilized During Treatment: Gait belt Activity Tolerance: Other (comment) (Hypotension/near syncope episode) Patient left: in chair;with call bell/phone within reach;with nursing/sitter in room   Rica Koyanagi  PTA [facility]  Acute  Rehab Pager      [phone]

## 2013-11-06 NOTE — Discharge Instructions (Addendum)
°Dr. Frank Aluisio °Total Joint Specialist °[facility] °[address] °[place], [place] [address] °[phone] ° ° ° °ANTERIOR APPROACH TOTAL HIP REPLACEMENT POSTOPERATIVE DIRECTIONS ° ° °Hip Rehabilitation, Guidelines Following Surgery  °The results of a hip operation are greatly improved after range of motion and muscle strengthening exercises. Follow all safety measures which are given to protect your hip. If any of these exercises cause increased pain or swelling in your joint, decrease the amount until you are comfortable again. Then slowly increase the exercises. Call your caregiver if you have problems or questions.  °HOME CARE INSTRUCTIONS  °Most of the following instructions are designed to prevent the dislocation of your new hip.  °Remove items at home which could result in a fall. This includes throw rugs or furniture in walking pathways.  °Continue medications as instructed at time of discharge. °· You may have some home medications which will be placed on hold until you complete the course of blood thinner medication. °· You may start showering once you are discharged home but do not submerge the incision under water. Just pat the incision dry and apply a dry gauze dressing on daily. °Do not put on socks or shoes without following the instructions of your caregivers.  °Sit on high chairs which makes it easier to stand.  °Sit on chairs with arms. Use the chair arms to help push yourself up when arising.  °Keep your leg on the side of the operation out in front of you when standing up.  °Arrange for the use of a toilet seat elevator so you are not sitting low.   °· Walk with walker as instructed.  °You may resume a sexual relationship in one month or when given the OK by your caregiver.  °Use walker as long as suggested by your caregivers.  °You may put full weight on your legs and walk as much as is comfortable. °Avoid periods of inactivity such as sitting longer than an hour  when not asleep. This helps prevent blood clots.  °You may return to work once you are cleared by your surgeon.  °Do not drive a car for 6 weeks or until released by your surgeon.  °Do not drive while taking narcotics.  °Wear elastic stockings for three weeks following surgery during the day but you may remove then at night.  °Make sure you keep all of your appointments after your operation with all of your doctors and caregivers. You should call the office at the above phone number and make an appointment for approximately two weeks after the date of your surgery. °Change the dressing daily and reapply a dry dressing each time. °Please pick up a stool softener and laxative for home use as long as you are requiring pain medications. °· Continue to use ice on the hip for pain and swelling from surgery. You may notice swelling that will progress down to the foot and ankle.  This is normal after  surgery.  Elevate the leg when you are not up walking on it.   °It is important for you to complete the blood thinner medication as prescribed by your doctor. °· Continue to use the breathing machine which will help keep your temperature down.  It is common for your temperature to cycle up and down following surgery, especially at night when you are not up moving around and exerting yourself.  The breathing machine keeps your lungs expanded and your temperature down. ° °RANGE OF MOTION AND STRENGTHENING EXERCISES  °  These exercises are designed to help you keep full movement of your hip joint. Follow your caregiver's or physical therapist's instructions. Perform all exercises about fifteen times, three times per day or as directed. Exercise both hips, even if you have had only one joint replacement. These exercises can be done on a training (exercise) mat, on the floor, on a table or on a bed. Use whatever works the best and is most comfortable for you. Use music or television while you are exercising so that the exercises are  a pleasant break in your day. This will make your life better with the exercises acting as a break in routine you can look forward to.  Lying on your back, slowly slide your foot toward your buttocks, raising your knee up off the floor. Then slowly slide your foot back down until your leg is straight again.  Lying on your back spread your legs as far apart as you can without causing discomfort.  Lying on your side, raise your upper leg and foot straight up from the floor as far as is comfortable. Slowly lower the leg and repeat.  Lying on your back, tighten up the muscle in the front of your thigh (quadriceps muscles). You can do this by keeping your leg straight and trying to raise your heel off the floor. This helps strengthen the largest muscle supporting your knee.  Lying on your back, tighten up the muscles of your buttocks both with the legs straight and with the knee bent at a comfortable angle while keeping your heel on the floor.   SKILLED REHAB INSTRUCTIONS: If the patient is transferred to a skilled rehab facility following release from the hospital, a list of the current medications will be sent to the facility for the patient to continue.  When discharged from the skilled rehab facility, please have the facility set up the patient's [place] prior to being released. Also, the skilled facility will be responsible for providing the patient with their medications at time of release from the facility to include their pain medication, the muscle relaxants, and their blood thinner medication. If the patient is still at the rehab facility at time of the two week follow up appointment, the skilled rehab facility will also need to assist the patient in arranging follow up appointment in our office and any transportation needs.  MAKE SURE YOU:  Understand these instructions.  Will watch your condition.  Will get help right away if you are not doing well or get worse.  Pick up  stool softner and laxative for home. Do not submerge incision under water. May shower. Continue to use ice for pain and swelling from surgery. Total Hip Protocol.  Take Xarelto for two and a half more weeks, then discontinue Xarelto. Once the patient has completed the blood thinner regimen, then take a Baby 81 mg Aspirin daily for three more weeks.   Information on my medicine - XARELTO (Rivaroxaban)  This medication education was reviewed with me or my healthcare representative as part of my discharge preparation.  The pharmacist that spoke with me during my hospital stay was:  Lolita Patella, [facility]  Why was Xarelto prescribed for you? Xarelto was prescribed for you to reduce the risk of blood clots forming after orthopedic surgery. The medical term for these abnormal blood clots is venous thromboembolism (VTE).  What do you need to know about xarelto ? Take your Xarelto ONCE DAILY at the same time every day. You  may take it either with or without food.  If you have difficulty swallowing the tablet whole, you may crush it and mix in applesauce just prior to taking your dose.  Take Xarelto exactly as prescribed by your doctor and DO NOT stop taking Xarelto without talking to the doctor who prescribed the medication.  Stopping without other VTE prevention medication to take the place of Xarelto may increase your risk of developing a clot.  After discharge, you should have regular check-up appointments with your healthcare provider that is prescribing your Xarelto.    What do you do if you miss a dose? If you miss a dose, take it as soon as you remember on the same day then continue your regularly scheduled once daily regimen the next day. Do not take two doses of Xarelto on the same day.   Important Safety Information A possible side effect of Xarelto is bleeding. You should call your healthcare provider right away if you experience any of the following:   Bleeding  from an injury or your nose that does not stop.   Unusual colored urine (red or dark brown) or unusual colored stools (red or black).   Unusual bruising for unknown reasons.   A serious fall or if you hit your head (even if there is no bleeding).  Some medicines may interact with Xarelto and might increase your risk of bleeding while on Xarelto. To help avoid this, consult your healthcare provider or pharmacist prior to using any new prescription or non-prescription medications, including herbals, vitamins, non-steroidal anti-inflammatory drugs (NSAIDs) and supplements.  This website has more information on Xarelto: [email or web address]

## 2013-11-06 NOTE — Progress Notes (Signed)
UR completed. Johnn Krasowski RN CCM Case Mgmt phone [phone] 

## 2013-11-06 NOTE — Progress Notes (Signed)
Physical Therapy Treatment Patient Details Name: LISETH WANN MRN: [id number] DOB: 11-Jan-1956 Today's Date: 11/06/2013 Time: 0900-0930 PT Time Calculation (min): 30 min  PT Assessment / Plan / Recommendation  History of Present Illness s/p DA  L THA   PT Comments   POD # 1 am session pt OOB walking out of BR with spouse "feeling good" and eager to D/C to home today.  Tx session focused on 12 steps to enter home.  Only amb pt 5 feet from recliner to w/c and used w/c to get pt to central stair well.  Spouse present and also assisted plus present for education.  Used one R rail and one L crutch had spouse assist pt up 10 steps when pt c/o "feeling bad", became pale and nauseous.   Chair brought to pt BP 84/42.  At this point is when PA came down steps and witnessed event.  RN also called to assist.  Pt left in stairwell with RN/PA and souse while she received IV bolus before attempting descending stairs as pt was in between 5th/6th floor on stairwell landing.    Follow Up Recommendations  Home health PT;No PT follow up     Does the patient have the potential to tolerate intense rehabilitation     Barriers to Discharge        Equipment Recommendations  None recommended by PT    Recommendations for Other Services    Frequency 7X/week   Progress towards PT Goals Progress towards PT goals: Progressing toward goals  Plan      Precautions / Restrictions Precautions Precautions: None Restrictions Weight Bearing Restrictions: No Other Position/Activity Restrictions: WBAT   Pertinent Vitals/Pain C/o nausea/dizziness    Mobility  Bed Mobility General bed mobility comments: Pt OOB in recliner Transfers Overall transfer level: Needs assistance Equipment used: Rolling walker (2 wheeled) Sit to Stand: Modified independent (Device/Increase time) General transfer comment: increased time Ambulation/Gait Ambulation Distance (Feet): 5 Feet General Gait Details: only amb from recliner to w/c  as Tx session focused on flight steps pt has to enter her home (12 steps) Stairs: Yes Stairs assistance: Min guard Stair Management: One rail Right;Forwards;With crutches Number of Stairs: 10 General stair comments: with spouse present for education performed acending 10 steps using one R raill up/L rail down and one crutch.  Pt/spouse required < 25% VC's on proper tech and safety.     PT Goals (current goals can now be found in the care plan section)    Visit Information  Last PT Received On: 11/06/13 Assistance Needed: +1 History of Present Illness: s/p DA  L THA    Subjective Data      Cognition       Balance     End of Session PT - End of Session Equipment Utilized During Treatment: Gait belt Activity Tolerance: Other (comment) (near syncope Vagul response during stair training) Patient left: in stairwell with RN and PA Will return in 15/20 min to allow IV fluids to run requested PA   Rica Koyanagi  PTA [facility]  Acute  Rehab Pager      [phone]

## 2013-11-06 NOTE — Discharge Summary (Signed)
Physician Discharge Summary   Patient ID: Katherine Rodriguez MRN: [id number] DOB/AGE: 20-Oct-1955 58 y.o.  Admit date: 11/05/2013 Discharge date: 11-06-2013  Primary Diagnosis:  Osteoarthritis of the Left hip.   Admission Diagnoses:  Past Medical History  Diagnosis Date  . Endometriosis   . Abnormal Pap smear of anus   . PPD positive   . Recurrent HSV (herpes simplex virus)   . S/P hip replacement 08/01/2011  . History of herpes labialis 08/01/2011  . Arthritis   . GERD (gastroesophageal reflux disease) 2012    hx of, none recent  . Pneumonia 1994    hx of  . Anemia 2010    hx of  . History of blood transfusion 2010    no reaction   Discharge Diagnoses:   Principal Problem:   OA (osteoarthritis) of hip  Estimated body mass index is 23.85 kg/(m^2) as calculated from the following:   Height as of this encounter: 5' 4.5" (1.638 m).   Weight as of this encounter: 63.988 kg (141 lb 1.1 oz).  Procedure(s) (LRB): LEFT TOTAL HIP ARTHROPLASTY ANTERIOR APPROACH (Left)   Consults: None  HPI: Katherine Rodriguez is a 58 y.o. female who has advanced end-  stage arthritis of his Left hip with progressively worsening pain and  dysfunction.The patient has failed nonoperative management and presents for  total hip arthroplasty.   Laboratory Data: Admission on 11/05/2013  Component Date Value Ref Range Status  . ABO/RH(D) 11/05/2013 A POS   Final  . Antibody Screen 11/05/2013 NEG   Final  . Sample Expiration 11/05/2013 11/08/2013   Final  . WBC 11/06/2013 12.0* 4.0 - 10.5 K/uL Final  . RBC 11/06/2013 3.53* 3.87 - 5.11 MIL/uL Final  . Hemoglobin 11/06/2013 10.6* 12.0 - 15.0 g/dL Final  . HCT 11/06/2013 32.0* 36.0 - 46.0 % Final  . MCV 11/06/2013 90.7  78.0 - 100.0 fL Final  . MCH 11/06/2013 30.0  26.0 - 34.0 pg Final  . MCHC 11/06/2013 33.1  30.0 - 36.0 g/dL Final  . RDW 11/06/2013 12.1  11.5 - 15.5 % Final  . Platelets 11/06/2013 164  150 - 400 K/uL Final  . Sodium 11/06/2013 140  137 -  147 mEq/L Final  . Potassium 11/06/2013 4.2  3.7 - 5.3 mEq/L Final  . Chloride 11/06/2013 104  96 - 112 mEq/L Final  . CO2 11/06/2013 29  19 - 32 mEq/L Final  . Glucose, Bld 11/06/2013 163* 70 - 99 mg/dL Final  . BUN 11/06/2013 8  6 - 23 mg/dL Final  . Creatinine, Ser 11/06/2013 0.63  0.50 - 1.10 mg/dL Final  . Calcium 11/06/2013 8.2* 8.4 - 10.5 mg/dL Final  . GFR calc non Af Amer 11/06/2013 >90  >90 mL/min Final  . GFR calc Af Amer 11/06/2013 >90  >90 mL/min Final   Comment: (NOTE)                          The eGFR has been calculated using the CKD EPI equation.                          This calculation has not been validated in all clinical situations.                          eGFR's persistently <90 mL/min signify possible Chronic Kidney  Disease.  Hospital Outpatient Visit on 10/28/2013  Component Date Value Ref Range Status  . MRSA, PCR 10/28/2013 NEGATIVE  NEGATIVE Final  . Staphylococcus aureus 10/28/2013 NEGATIVE  NEGATIVE Final   Comment:                                 The Xpert SA Assay (FDA                          approved for NASAL specimens                          in patients over 41 years of age),                          is one component of                          a comprehensive surveillance                          program.  Test performance has                          been validated by [employer] for patients greater                          than or equal to 99 year old.                          It is not intended                          to diagnose infection nor to                          guide or monitor treatment.  Marland Kitchen aPTT 10/28/2013 29  24 - 37 seconds Final  . WBC 10/28/2013 5.0  4.0 - 10.5 K/uL Final  . RBC 10/28/2013 4.41  3.87 - 5.11 MIL/uL Final  . Hemoglobin 10/28/2013 13.4  12.0 - 15.0 g/dL Final  . HCT 10/28/2013 40.4  36.0 - 46.0 % Final  . MCV 10/28/2013 91.6  78.0 - 100.0 fL Final  . MCH  10/28/2013 30.4  26.0 - 34.0 pg Final  . MCHC 10/28/2013 33.2  30.0 - 36.0 g/dL Final  . RDW 10/28/2013 12.1  11.5 - 15.5 % Final  . Platelets 10/28/2013 228  150 - 400 K/uL Final  . Sodium 10/28/2013 140  137 - 147 mEq/L Final  . Potassium 10/28/2013 4.3  3.7 - 5.3 mEq/L Final  . Chloride 10/28/2013 101  96 - 112 mEq/L Final  . CO2 10/28/2013 30  19 - 32 mEq/L Final  . Glucose, Bld 10/28/2013 53* 70 - 99 mg/dL Final  . BUN 10/28/2013 10  6 - 23 mg/dL Final  . Creatinine, Ser 10/28/2013 0.66  0.50 - 1.10 mg/dL Final  . Calcium 10/28/2013 9.6  8.4 - 10.5 mg/dL Final  .  Total Protein 10/28/2013 7.1  6.0 - 8.3 g/dL Final  . Albumin 10/28/2013 3.8  3.5 - 5.2 g/dL Final  . AST 10/28/2013 20  0 - 37 U/L Final  . ALT 10/28/2013 24  0 - 35 U/L Final  . Alkaline Phosphatase 10/28/2013 70  39 - 117 U/L Final  . Total Bilirubin 10/28/2013 0.3  0.3 - 1.2 mg/dL Final  . GFR calc non Af Amer 10/28/2013 >90  >90 mL/min Final  . GFR calc Af Amer 10/28/2013 >90  >90 mL/min Final   Comment: (NOTE)                          The eGFR has been calculated using the CKD EPI equation.                          This calculation has not been validated in all clinical situations.                          eGFR's persistently <90 mL/min signify possible Chronic Kidney                          Disease.  Marland Kitchen Prothrombin Time 10/28/2013 12.5  11.6 - 15.2 seconds Final  . INR 10/28/2013 0.95  0.00 - 1.49 Final  . Color, Urine 10/28/2013 YELLOW  YELLOW Final  . APPearance 10/28/2013 CLEAR  CLEAR Final  . Specific Gravity, Urine 10/28/2013 1.006  1.005 - 1.030 Final  . pH 10/28/2013 7.5  5.0 - 8.0 Final  . Glucose, UA 10/28/2013 NEGATIVE  NEGATIVE mg/dL Final  . Hgb urine dipstick 10/28/2013 NEGATIVE  NEGATIVE Final  . Bilirubin Urine 10/28/2013 NEGATIVE  NEGATIVE Final  . Ketones, ur 10/28/2013 NEGATIVE  NEGATIVE mg/dL Final  . Protein, ur 10/28/2013 NEGATIVE  NEGATIVE mg/dL Final  . Urobilinogen, UA 10/28/2013 0.2   0.0 - 1.0 mg/dL Final  . Nitrite 10/28/2013 NEGATIVE  NEGATIVE Final  . Leukocytes, UA 10/28/2013 NEGATIVE  NEGATIVE Final   MICROSCOPIC NOT DONE ON URINES WITH NEGATIVE PROTEIN, BLOOD, LEUKOCYTES, NITRITE, OR GLUCOSE <1000 mg/dL.  Office Visit on 10/15/2013  Component Date Value Ref Range Status  . Sodium 10/15/2013 139  135 - 145 mEq/L Final  . Potassium 10/15/2013 4.8  3.5 - 5.1 mEq/L Final  . Chloride 10/15/2013 103  96 - 112 mEq/L Final  . CO2 10/15/2013 29  19 - 32 mEq/L Final  . Glucose, Bld 10/15/2013 87  70 - 99 mg/dL Final  . BUN 10/15/2013 15  6 - 23 mg/dL Final  . Creatinine, Ser 10/15/2013 0.6  0.4 - 1.2 mg/dL Final  . Calcium 10/15/2013 9.5  8.4 - 10.5 mg/dL Final  . GFR 10/15/2013 106.61  >60.00 mL/min Final  . WBC 10/15/2013 6.4  4.5 - 10.5 K/uL Final  . RBC 10/15/2013 4.35  3.87 - 5.11 Mil/uL Final  . Hemoglobin 10/15/2013 13.1  12.0 - 15.0 g/dL Final  . HCT 10/15/2013 40.9  36.0 - 46.0 % Final  . MCV 10/15/2013 93.9  78.0 - 100.0 fl Final  . MCHC 10/15/2013 32.1  30.0 - 36.0 g/dL Final  . RDW 10/15/2013 12.7  11.5 - 14.6 % Final  . Platelets 10/15/2013 242.0  150.0 - 400.0 K/uL Final  . Neutrophils Relative % 10/15/2013 59.3  43.0 - 77.0 % Final  . Lymphocytes Relative 10/15/2013 30.9  12.0 - 46.0 % Final  . Monocytes Relative 10/15/2013 8.5  3.0 - 12.0 % Final  . Eosinophils Relative 10/15/2013 0.8  0.0 - 5.0 % Final  . Basophils Relative 10/15/2013 0.5  0.0 - 3.0 % Final  . Neutro Abs 10/15/2013 3.8  1.4 - 7.7 K/uL Final  . Lymphs Abs 10/15/2013 2.0  0.7 - 4.0 K/uL Final  . Monocytes Absolute 10/15/2013 0.5  0.1 - 1.0 K/uL Final  . Eosinophils Absolute 10/15/2013 0.1  0.0 - 0.7 K/uL Final  . Basophils Absolute 10/15/2013 0.0  0.0 - 0.1 K/uL Final  . Total Bilirubin 10/15/2013 0.6  0.3 - 1.2 mg/dL Final  . Bilirubin, Direct 10/15/2013 0.0  0.0 - 0.3 mg/dL Final  . Alkaline Phosphatase 10/15/2013 65  39 - 117 U/L Final  . AST 10/15/2013 22  0 - 37 U/L Final  .  ALT 10/15/2013 26  0 - 35 U/L Final  . Total Protein 10/15/2013 7.2  6.0 - 8.3 g/dL Final  . Albumin 10/15/2013 4.1  3.5 - 5.2 g/dL Final  . Cholesterol 10/15/2013 170  0 - 200 mg/dL Final   ATP III Classification       Desirable:  < 200 mg/dL               Borderline High:  200 - 239 mg/dL          High:  > = 240 mg/dL  . Triglycerides 10/15/2013 72.0  0.0 - 149.0 mg/dL Final   Normal:  <150 mg/dLBorderline High:  150 - 199 mg/dL  . HDL 10/15/2013 68.80  >39.00 mg/dL Final  . VLDL 10/15/2013 14.4  0.0 - 40.0 mg/dL Final  . LDL Cholesterol 10/15/2013 87  0 - 99 mg/dL Final  . Total CHOL/HDL Ratio 10/15/2013 2   Final                  Men          Women1/2 Average Risk     3.4          3.3Average Risk          5.0          4.42X Average Risk          9.6          7.13X Average Risk          15.0          11.0                      . TSH 10/15/2013 0.59  0.35 - 5.50 uIU/mL Final     X-Rays:Dg Hip Complete Left  10/30/2013   CLINICAL DATA Osteoarthritis of the left hip.  EXAM LEFT HIP - COMPLETE 2+ VIEW o  COMPARISON None.  FINDINGS There is severe osteoarthritis of the left hip with cystic degenerative changes of the femoral head, superior joint space loss, sclerosis, and peripheral osteophytes on the femoral head.  The patient has a right total hip prosthesis in place.  IMPRESSION Severe osteoarthritis of the left hip.  SIGNATURE  Electronically Signed   By: Rozetta Nunnery M.D.   On: 10/30/2013 08:03   Dg Pelvis Portable  11/05/2013   CLINICAL DATA:  Postop left hip replacement  EXAM: PORTABLE PELVIS 1-2 VIEWS  COMPARISON:  05/19/2009  FINDINGS: Left hip replacement in satisfactory position and alignment. No fracture or acute complication.  Pre-existing right hip replacement also appears satisfactory.  IMPRESSION:  Satisfactory left hip replacement.   Electronically Signed   By: Franchot Gallo M.D.   On: 11/05/2013 10:57   Dg C-arm 1-60 Min-no Report  11/05/2013   CLINICAL DATA: lt total hip  replacement   C-ARM 1-60 MINUTES  Fluoroscopy was utilized by the requesting physician.  No radiographic  interpretation.    Mm Screening Breast Tomo Bilateral  10/08/2013   CLINICAL DATA:  Screening.  EXAM: DIGITAL SCREENING BILATERAL MAMMOGRAM WITH 3D TOMO WITH CAD  COMPARISON:  Previous exam(s).  ACR Breast Density Category b: There are scattered areas of fibroglandular density.  FINDINGS: There are no findings suspicious for malignancy. Images were processed with CAD.  IMPRESSION: No mammographic evidence of malignancy. A result letter of this screening mammogram will be mailed directly to the patient.  RECOMMENDATION: Screening mammogram in one year. (Code:[id number]-B-01Y)  BI-RADS CATEGORY  1: Negative.   Electronically Signed   By: Lillia Mountain M.D.   On: 10/08/2013 12:51    EKG: Orders placed in visit on 05/11/09  . [place] [facility] Course: Patient was admitted to [facility] and taken to the OR and underwent the above state procedure without complications.  Patient tolerated the procedure well and was later transferred to the recovery room and then to the orthopaedic floor for postoperative care.  They were given PO and IV analgesics for pain control following their surgery.  They were given 24 hours of postoperative antibiotics of  Anti-infectives   Start     Dose/Rate Route Frequency Ordered Stop   11/05/13 1500  ceFAZolin (ANCEF) IVPB 1 g/50 mL premix     1 g 100 mL/hr over 30 Minutes Intravenous Every 6 hours 11/05/13 1232 11/05/13 2136   11/05/13 0712  ceFAZolin (ANCEF) IVPB 2 g/50 mL premix     2 g 100 mL/hr over 30 Minutes Intravenous On call to O.R. 11/05/13 1610 11/05/13 0836     and started on DVT prophylaxis in the form of Xarelto.   PT and OT were ordered for total hip protocol.  The patient was allowed to be WBAT with therapy. Discharge planning was consulted to help with postop disposition and equipment needs.  Patient had a great night on the evening of  surgery.  They started to get up OOB with therapy on day one. Patient was seen in rounds that morning and was doing very good.  She had already been up walking the day of surgery and it was felt that she would be ready to go home later that day.Marland Kitchen  Discharge home with home health  Diet - Regular diet  Follow up - in 2 weeks with Arlee Muslim, [facility] for Dr. Wynelle Link. Activity - WBAT  Disposition - Home  Condition Upon Discharge - Good  D/C Meds - See DC Summary  DVT Prophylaxis - Xarelto   Discharge Orders   Future Orders Complete By Expires   Call MD / Call 911  As directed    Comments:     If you experience chest pain or shortness of breath, CALL 911 and be transported to the hospital emergency room.  If you develope a fever above 101 F, pus (white drainage) or increased drainage or redness at the wound, or calf pain, call your surgeon's office.   Change dressing  As directed    Comments:     You may change your dressing dressing daily with sterile 4 x 4 inch gauze dressing and paper tape.  Do  not submerge the incision under water.   Constipation Prevention  As directed    Comments:     Drink plenty of fluids.  Prune juice may be helpful.  You may use a stool softener, such as Colace (over the counter) 100 mg twice a day.  Use MiraLax (over the counter) for constipation as needed.   Diet general  As directed    Discharge instructions  As directed    Comments:     Pick up stool softner and laxative for home. Do not submerge incision under water. May shower. Continue to use ice for pain and swelling from surgery.  Total Hip Protocol.  Take Xarelto for two and a half more weeks, then discontinue Xarelto.   Do not sit on low chairs, stoools or toilet seats, as it may be difficult to get up from low surfaces  As directed    Driving restrictions  As directed    Comments:     No driving until released by the physician.   Increase activity slowly as tolerated  As directed    Lifting  restrictions  As directed    Comments:     No lifting until released by the physician.   Patient may shower  As directed    Comments:     You may shower without a dressing once there is no drainage.  Do not wash over the wound.  If drainage remains, do not shower until drainage stops.   TED hose  As directed    Comments:     Use stockings (TED hose) for 3 weeks on both leg(s).  You may remove them at night for sleeping.   Weight bearing as tolerated  As directed    Questions:     Laterality:  left   Extremity:  Lower       Medication List    STOP taking these medications       ALEVE 220 MG tablet  Generic drug:  naproxen sodium     PROBIOTIC DAILY PO     Vitamin D-3 5000 UNITS Tabs      TAKE these medications       celecoxib 200 MG capsule  Commonly known as:  CELEBREX  Take 200 mg by mouth daily.     methocarbamol 500 MG tablet  Commonly known as:  ROBAXIN  Take 1 tablet (500 mg total) by mouth every 6 (six) hours as needed for muscle spasms.     oxyCODONE 5 MG immediate release tablet  Commonly known as:  Oxy IR/ROXICODONE  Take 1-2 tablets (5-10 mg total) by mouth every 3 (three) hours as needed for moderate pain, severe pain or breakthrough pain.     polyethylene glycol packet  Commonly known as:  MIRALAX / GLYCOLAX  Take 17 g by mouth daily.     rivaroxaban 10 MG Tabs tablet  Commonly known as:  XARELTO  - Take 1 tablet (10 mg total) by mouth daily with breakfast. Take Xarelto for two and a half more weeks, then discontinue Xarelto.  - Once the patient has completed the blood thinner regimen, then take a Baby 81 mg Aspirin daily for three more weeks.     traMADol 50 MG tablet  Commonly known as:  ULTRAM  Take 1-2 tablets (50-100 mg total) by mouth every 6 (six) hours as needed (mild to moderate pain).     TYLENOL 500 MG tablet  Generic drug:  acetaminophen  Take 500 mg by mouth every 6 (  six) hours as needed for mild pain or moderate pain.             Follow-up Information   Follow up with Mickel Crow, PA-C On 11/19/2013. (Call for appointment time and follow up with Arlee Muslim [facility] for Dr Wynelle Link.)    Specialty:  Orthopedic Surgery   Contact information:   [address] [place] [address] [phone]       Signed: Mickel Crow 11/06/2013, 8:31 AM

## 2013-11-06 NOTE — Progress Notes (Signed)
   CARE MANAGEMENT NOTE 11/06/2013  Patient:  CHANCE, KARAM   Account Number:  [id number]  Date Initiated:  11/06/2013  Documentation initiated by:  [facility]  Subjective/Objective Assessment:   LEFT TOTAL HIP ARTHROPLASTY ANTERIOR APPROACH     Action/Plan:   [place]  husband at home to assist with care   Anticipated DC Date:  11/06/2013   Anticipated DC Plan:  [place]  CM consult      [facility] Choice  HOME HEALTH   Choice offered to / List presented to:  C-1 Patient        [place] arranged  [place] [facility]      [place].   Status of service:  Completed, signed off Medicare Important Message given?   (If response is "NO", the following Medicare IM given date fields will be blank) Date Medicare IM given:   Date Additional Medicare IM given:    Discharge Disposition:  [place]  Per UR Regulation:    If discussed at Long Length of Stay Meetings, dates discussed:    Comments:  11/06/2013 1100 NCM spoke to pt and offered choice for [facility]. Pt requested [facility] for HH. Pt has RW, 3n1 and crutches at home. Notified [facility] of referral for [facility]. Jonnie Finner RN CCM Case Mgmt phone [phone]

## 2013-11-07 LAB — BASIC METABOLIC PANEL
BUN: 8 mg/dL (ref 6–23)
CALCIUM: 8.4 mg/dL (ref 8.4–10.5)
CHLORIDE: 106 meq/L (ref 96–112)
CO2: 28 meq/L (ref 19–32)
Creatinine, Ser: 0.53 mg/dL (ref 0.50–1.10)
GFR calc Af Amer: >90 mL/min (ref >90)
GFR calc non Af Amer: >90 mL/min (ref >90)
Glucose, Bld: 123 mg/dL — ABNORMAL HIGH (ref 70–99)
POTASSIUM: 4.1 meq/L (ref 3.7–5.3)
Sodium: 141 mEq/L (ref 137–147)

## 2013-11-07 LAB — CBC
HCT: 29.9 % — ABNORMAL LOW (ref 36.0–46.0)
Hemoglobin: 10.1 g/dL — ABNORMAL LOW (ref 12.0–15.0)
MCH: 30.6 pg (ref 26.0–34.0)
MCHC: 33.8 g/dL (ref 30.0–36.0)
MCV: 90.6 fL (ref 78.0–100.0)
PLATELETS: 156 10*3/uL (ref 150–400)
RBC: 3.3 MIL/uL — AB (ref 3.87–5.11)
RDW: 12.3 % (ref 11.5–15.5)
WBC: 11.6 10*3/uL — AB (ref 4.0–10.5)

## 2013-11-07 NOTE — Progress Notes (Signed)
Physical Therapy Treatment Patient Details Name: Katherine Rodriguez MRN: [id number] DOB: 05-31-1956 Today's Date: 11/07/2013 Time: 8502-7741 PT Time Calculation (min): 29 min  PT Assessment / Plan / Recommendation  History of Present Illness s/p DA  L THA   PT Comments   POD # 2 pt feeling "much better".  Practiced a flight of steps with souse with no issues.  Pt tolerated amb in hallway after.  Pt/spouse instructed on sleeping positions and use of ICE. Pt ready for D/C to home.   Follow Up Recommendations  Home health PT;No PT follow up (pt preference)     Does the patient have the potential to tolerate intense rehabilitation     Barriers to Discharge        Equipment Recommendations  None recommended by PT    Recommendations for Other Services    Frequency 7X/week   Progress towards PT Goals Progress towards PT goals: Progressing toward goals  Plan      Precautions / Restrictions Precautions Precautions: None Restrictions Weight Bearing Restrictions: No Other Position/Activity Restrictions: WBAT   Pertinent Vitals/Pain     Mobility  Bed Mobility General bed mobility comments: Pt OOB in recliner Transfers Overall transfer level: Needs assistance Equipment used: Rolling walker (2 wheeled) Transfers: Sit to/from Stand Sit to Stand: Supervision General transfer comment: good safety cognition Ambulation/Gait Ambulation/Gait assistance: Supervision Ambulation Distance (Feet): 85 Feet Assistive device: Rolling walker (2 wheeled) Gait Pattern/deviations: Step-to pattern;Step-through pattern Gait velocity: WFL General Gait Details: amb in hallway with spouse using good safe handling Stairs: Yes Stairs assistance: Min guard Stair Management: One rail Right;Forwards;With crutches Number of Stairs: 10 General stair comments: with spouse and only required one VC on proper sequencing descending steps "down with the bad"     PT Goals (current goals can now be found in the  care plan section)    Visit Information  Last PT Received On: 11/07/13 Assistance Needed: +1 History of Present Illness: s/p DA  L THA    Subjective Data      Cognition       Balance     End of Session PT - End of Session Equipment Utilized During Treatment: Gait belt Activity Tolerance: Patient tolerated treatment well Patient left: in chair;with call bell/phone within reach   Rica Koyanagi  PTA [facility]  Acute  Rehab Pager      [phone]

## 2013-11-07 NOTE — Progress Notes (Signed)
RN reviewed discharge instructions with patient and husband. All questions answered.   Paperwork and paperwork given to patient.  NT took patient to family car via wheelchair.

## 2013-11-07 NOTE — Progress Notes (Signed)
   Subjective: 2 Days Post-Op Procedure(s) (LRB): LEFT TOTAL HIP ARTHROPLASTY ANTERIOR APPROACH (Left) Patient reports pain as mild.   Patient seen in rounds for Dr. Wynelle Link.  Feeling much better today after fluids and rest.  Plan on going home today. Patient is well, and has had no acute complaints or problems Patient is ready to go home  Objective: Vital signs in last 24 hours: Temp:  [97.8 F (36.6 C)-98.7 F (37.1 C)] 98.6 F (37 C) (03/20 0431) Pulse Rate:  [58-74] 68 (03/20 0431) Resp:  [16-18] 16 (03/20 0431) BP: (90-110)/(51-64) 103/64 mmHg (03/20 0646) SpO2:  [94 %-100 %] 97 % (03/20 0431)  Intake/Output from previous day:  Intake/Output Summary (Last 24 hours) at 11/07/13 0738 Last data filed at 11/07/13 6045  Gross per 24 hour  Intake 1874.16 ml  Output   2975 ml  Net -1100.84 ml    Intake/Output this shift:    Labs:  Recent Labs  11/06/13 0435 11/07/13 0414  HGB 10.6* 10.1*    Recent Labs  11/06/13 0435 11/07/13 0414  WBC 12.0* 11.6*  RBC 3.53* 3.30*  HCT 32.0* 29.9*  PLT 164 156    Recent Labs  11/06/13 0435 11/07/13 0414  NA 140 141  K 4.2 4.1  CL 104 106  CO2 29 28  BUN 8 8  CREATININE 0.63 0.53  GLUCOSE 163* 123*  CALCIUM 8.2* 8.4   No results found for this basename: LABPT, INR,  in the last 72 hours  EXAM: General - Patient is Alert, Appropriate and Oriented Extremity - Neurovascular intact Sensation intact distally Dorsiflexion/Plantar flexion intact Incision - clean, dry, no drainage, healing Motor Function - intact, moving foot and toes well on exam.   Assessment/Plan: 2 Days Post-Op Procedure(s) (LRB): LEFT TOTAL HIP ARTHROPLASTY ANTERIOR APPROACH (Left) Procedure(s) (LRB): LEFT TOTAL HIP ARTHROPLASTY ANTERIOR APPROACH (Left) Past Medical History  Diagnosis Date  . Endometriosis   . Abnormal Pap smear of anus   . PPD positive   . Recurrent HSV (herpes simplex virus)   . S/P hip replacement 08/01/2011  .  History of herpes labialis 08/01/2011  . Arthritis   . GERD (gastroesophageal reflux disease) 2012    hx of, none recent  . Pneumonia 1994    hx of  . Anemia 2010    hx of  . History of blood transfusion 2010    no reaction   Principal Problem:   OA (osteoarthritis) of hip  Estimated body mass index is 23.85 kg/(m^2) as calculated from the following:   Height as of this encounter: 5' 4.5" (1.638 m).   Weight as of this encounter: 63.988 kg (141 lb 1.1 oz). Up with therapy Discharge home with home health Diet - Regular diet Follow up - in 2 weeks with Arlee Muslim, [facility] for Dr. Wynelle Link Activity - WBAT Disposition - Home Condition Upon Discharge - Good D/C Meds - See DC Summary DVT Prophylaxis - Xarelto  PERKINS, ALEXZANDREW 11/07/2013, 7:38 AM

## 2013-11-07 NOTE — Discharge Summary (Signed)
ADDENDUM - Physician Discharge Summary   Patient ID: Katherine Rodriguez MRN: [id number] DOB/AGE: June 16, 1956 58 y.o.  Admit date: 11/05/2013 Discharge date: 11-07-2013  Primary Diagnosis:  Osteoarthritis of the Left hip.   Admission Diagnoses:  Past Medical History  Diagnosis Date  . Endometriosis   . Abnormal Pap smear of anus   . PPD positive   . Recurrent HSV (herpes simplex virus)   . S/P hip replacement 08/01/2011  . History of herpes labialis 08/01/2011  . Arthritis   . GERD (gastroesophageal reflux disease) 2012    hx of, none recent  . Pneumonia 1994    hx of  . Anemia 2010    hx of  . History of blood transfusion 2010    no reaction   Discharge Diagnoses:   Principal Problem:   OA (osteoarthritis) of hip  Estimated body mass index is 23.85 kg/(m^2) as calculated from the following:   Height as of this encounter: 5' 4.5" (1.638 m).   Weight as of this encounter: 63.988 kg (141 lb 1.1 oz).  Procedure(s) (LRB): LEFT TOTAL HIP ARTHROPLASTY ANTERIOR APPROACH (Left)   Consults: None  HPI: Katherine Rodriguez is a 58 y.o. female who has advanced end-  stage arthritis of his Left hip with progressively worsening pain and  dysfunction.The patient has failed nonoperative management and presents for  total hip arthroplasty.   Laboratory Data: Admission on 11/05/2013  Component Date Value Ref Range Status  . ABO/RH(D) 11/05/2013 A POS   Final  . Antibody Screen 11/05/2013 NEG   Final  . Sample Expiration 11/05/2013 11/08/2013   Final  . WBC 11/06/2013 12.0* 4.0 - 10.5 K/uL Final  . RBC 11/06/2013 3.53* 3.87 - 5.11 MIL/uL Final  . Hemoglobin 11/06/2013 10.6* 12.0 - 15.0 g/dL Final  . HCT 11/06/2013 32.0* 36.0 - 46.0 % Final  . MCV 11/06/2013 90.7  78.0 - 100.0 fL Final  . MCH 11/06/2013 30.0  26.0 - 34.0 pg Final  . MCHC 11/06/2013 33.1  30.0 - 36.0 g/dL Final  . RDW 11/06/2013 12.1  11.5 - 15.5 % Final  . Platelets 11/06/2013 164  150 - 400 K/uL Final  . Sodium 11/06/2013  140  137 - 147 mEq/L Final  . Potassium 11/06/2013 4.2  3.7 - 5.3 mEq/L Final  . Chloride 11/06/2013 104  96 - 112 mEq/L Final  . CO2 11/06/2013 29  19 - 32 mEq/L Final  . Glucose, Bld 11/06/2013 163* 70 - 99 mg/dL Final  . BUN 11/06/2013 8  6 - 23 mg/dL Final  . Creatinine, Ser 11/06/2013 0.63  0.50 - 1.10 mg/dL Final  . Calcium 11/06/2013 8.2* 8.4 - 10.5 mg/dL Final  . GFR calc non Af Amer 11/06/2013 >90  >90 mL/min Final  . GFR calc Af Amer 11/06/2013 >90  >90 mL/min Final   Comment: (NOTE)                          The eGFR has been calculated using the CKD EPI equation.                          This calculation has not been validated in all clinical situations.                          eGFR's persistently <90 mL/min signify possible Chronic Kidney  Disease.  . WBC 11/07/2013 11.6* 4.0 - 10.5 K/uL Final  . RBC 11/07/2013 3.30* 3.87 - 5.11 MIL/uL Final  . Hemoglobin 11/07/2013 10.1* 12.0 - 15.0 g/dL Final  . HCT 11/07/2013 29.9* 36.0 - 46.0 % Final  . MCV 11/07/2013 90.6  78.0 - 100.0 fL Final  . MCH 11/07/2013 30.6  26.0 - 34.0 pg Final  . MCHC 11/07/2013 33.8  30.0 - 36.0 g/dL Final  . RDW 11/07/2013 12.3  11.5 - 15.5 % Final  . Platelets 11/07/2013 156  150 - 400 K/uL Final  . Sodium 11/07/2013 141  137 - 147 mEq/L Final  . Potassium 11/07/2013 4.1  3.7 - 5.3 mEq/L Final  . Chloride 11/07/2013 106  96 - 112 mEq/L Final  . CO2 11/07/2013 28  19 - 32 mEq/L Final  . Glucose, Bld 11/07/2013 123* 70 - 99 mg/dL Final  . BUN 11/07/2013 8  6 - 23 mg/dL Final  . Creatinine, Ser 11/07/2013 0.53  0.50 - 1.10 mg/dL Final  . Calcium 11/07/2013 8.4  8.4 - 10.5 mg/dL Final  . GFR calc non Af Amer 11/07/2013 >90  >90 mL/min Final  . GFR calc Af Amer 11/07/2013 >90  >90 mL/min Final   Comment: (NOTE)                          The eGFR has been calculated using the CKD EPI equation.                          This calculation has not been validated in all clinical  situations.                          eGFR's persistently <90 mL/min signify possible Chronic Kidney                          Disease.  Hospital Outpatient Visit on 10/28/2013  Component Date Value Ref Range Status  . MRSA, PCR 10/28/2013 NEGATIVE  NEGATIVE Final  . Staphylococcus aureus 10/28/2013 NEGATIVE  NEGATIVE Final   Comment:                                 The Xpert SA Assay (FDA                          approved for NASAL specimens                          in patients over 43 years of age),                          is one component of                          a comprehensive surveillance                          program.  Test performance has                          been validated by [employer]  [facility] for patients greater                          than or equal to 43 year old.                          It is not intended                          to diagnose infection nor to                          guide or monitor treatment.  Marland Kitchen aPTT 10/28/2013 29  24 - 37 seconds Final  . WBC 10/28/2013 5.0  4.0 - 10.5 K/uL Final  . RBC 10/28/2013 4.41  3.87 - 5.11 MIL/uL Final  . Hemoglobin 10/28/2013 13.4  12.0 - 15.0 g/dL Final  . HCT 10/28/2013 40.4  36.0 - 46.0 % Final  . MCV 10/28/2013 91.6  78.0 - 100.0 fL Final  . MCH 10/28/2013 30.4  26.0 - 34.0 pg Final  . MCHC 10/28/2013 33.2  30.0 - 36.0 g/dL Final  . RDW 10/28/2013 12.1  11.5 - 15.5 % Final  . Platelets 10/28/2013 228  150 - 400 K/uL Final  . Sodium 10/28/2013 140  137 - 147 mEq/L Final  . Potassium 10/28/2013 4.3  3.7 - 5.3 mEq/L Final  . Chloride 10/28/2013 101  96 - 112 mEq/L Final  . CO2 10/28/2013 30  19 - 32 mEq/L Final  . Glucose, Bld 10/28/2013 53* 70 - 99 mg/dL Final  . BUN 10/28/2013 10  6 - 23 mg/dL Final  . Creatinine, Ser 10/28/2013 0.66  0.50 - 1.10 mg/dL Final  . Calcium 10/28/2013 9.6  8.4 - 10.5 mg/dL Final  . Total Protein 10/28/2013 7.1  6.0 - 8.3 g/dL Final  . Albumin 10/28/2013 3.8   3.5 - 5.2 g/dL Final  . AST 10/28/2013 20  0 - 37 U/L Final  . ALT 10/28/2013 24  0 - 35 U/L Final  . Alkaline Phosphatase 10/28/2013 70  39 - 117 U/L Final  . Total Bilirubin 10/28/2013 0.3  0.3 - 1.2 mg/dL Final  . GFR calc non Af Amer 10/28/2013 >90  >90 mL/min Final  . GFR calc Af Amer 10/28/2013 >90  >90 mL/min Final   Comment: (NOTE)                          The eGFR has been calculated using the CKD EPI equation.                          This calculation has not been validated in all clinical situations.                          eGFR's persistently <90 mL/min signify possible Chronic Kidney                          Disease.  Marland Kitchen Prothrombin Time 10/28/2013 12.5  11.6 - 15.2 seconds Final  . INR 10/28/2013 0.95  0.00 - 1.49 Final  . Color, Urine 10/28/2013 YELLOW  YELLOW Final  . APPearance 10/28/2013 CLEAR  CLEAR Final  . Specific Gravity, Urine 10/28/2013 1.006  1.005 -  1.030 Final  . pH 10/28/2013 7.5  5.0 - 8.0 Final  . Glucose, UA 10/28/2013 NEGATIVE  NEGATIVE mg/dL Final  . Hgb urine dipstick 10/28/2013 NEGATIVE  NEGATIVE Final  . Bilirubin Urine 10/28/2013 NEGATIVE  NEGATIVE Final  . Ketones, ur 10/28/2013 NEGATIVE  NEGATIVE mg/dL Final  . Protein, ur 10/28/2013 NEGATIVE  NEGATIVE mg/dL Final  . Urobilinogen, UA 10/28/2013 0.2  0.0 - 1.0 mg/dL Final  . Nitrite 10/28/2013 NEGATIVE  NEGATIVE Final  . Leukocytes, UA 10/28/2013 NEGATIVE  NEGATIVE Final   MICROSCOPIC NOT DONE ON URINES WITH NEGATIVE PROTEIN, BLOOD, LEUKOCYTES, NITRITE, OR GLUCOSE <1000 mg/dL.  Office Visit on 10/15/2013  Component Date Value Ref Range Status  . Sodium 10/15/2013 139  135 - 145 mEq/L Final  . Potassium 10/15/2013 4.8  3.5 - 5.1 mEq/L Final  . Chloride 10/15/2013 103  96 - 112 mEq/L Final  . CO2 10/15/2013 29  19 - 32 mEq/L Final  . Glucose, Bld 10/15/2013 87  70 - 99 mg/dL Final  . BUN 10/15/2013 15  6 - 23 mg/dL Final  . Creatinine, Ser 10/15/2013 0.6  0.4 - 1.2 mg/dL Final  . Calcium  10/15/2013 9.5  8.4 - 10.5 mg/dL Final  . GFR 10/15/2013 106.61  >60.00 mL/min Final  . WBC 10/15/2013 6.4  4.5 - 10.5 K/uL Final  . RBC 10/15/2013 4.35  3.87 - 5.11 Mil/uL Final  . Hemoglobin 10/15/2013 13.1  12.0 - 15.0 g/dL Final  . HCT 10/15/2013 40.9  36.0 - 46.0 % Final  . MCV 10/15/2013 93.9  78.0 - 100.0 fl Final  . MCHC 10/15/2013 32.1  30.0 - 36.0 g/dL Final  . RDW 10/15/2013 12.7  11.5 - 14.6 % Final  . Platelets 10/15/2013 242.0  150.0 - 400.0 K/uL Final  . Neutrophils Relative % 10/15/2013 59.3  43.0 - 77.0 % Final  . Lymphocytes Relative 10/15/2013 30.9  12.0 - 46.0 % Final  . Monocytes Relative 10/15/2013 8.5  3.0 - 12.0 % Final  . Eosinophils Relative 10/15/2013 0.8  0.0 - 5.0 % Final  . Basophils Relative 10/15/2013 0.5  0.0 - 3.0 % Final  . Neutro Abs 10/15/2013 3.8  1.4 - 7.7 K/uL Final  . Lymphs Abs 10/15/2013 2.0  0.7 - 4.0 K/uL Final  . Monocytes Absolute 10/15/2013 0.5  0.1 - 1.0 K/uL Final  . Eosinophils Absolute 10/15/2013 0.1  0.0 - 0.7 K/uL Final  . Basophils Absolute 10/15/2013 0.0  0.0 - 0.1 K/uL Final  . Total Bilirubin 10/15/2013 0.6  0.3 - 1.2 mg/dL Final  . Bilirubin, Direct 10/15/2013 0.0  0.0 - 0.3 mg/dL Final  . Alkaline Phosphatase 10/15/2013 65  39 - 117 U/L Final  . AST 10/15/2013 22  0 - 37 U/L Final  . ALT 10/15/2013 26  0 - 35 U/L Final  . Total Protein 10/15/2013 7.2  6.0 - 8.3 g/dL Final  . Albumin 10/15/2013 4.1  3.5 - 5.2 g/dL Final  . Cholesterol 10/15/2013 170  0 - 200 mg/dL Final   ATP III Classification       Desirable:  < 200 mg/dL               Borderline High:  200 - 239 mg/dL          High:  > = 240 mg/dL  . Triglycerides 10/15/2013 72.0  0.0 - 149.0 mg/dL Final   Normal:  <150 mg/dLBorderline High:  150 - 199 mg/dL  . HDL 10/15/2013 68.80  >  39.00 mg/dL Final  . VLDL 10/15/2013 14.4  0.0 - 40.0 mg/dL Final  . LDL Cholesterol 10/15/2013 87  0 - 99 mg/dL Final  . Total CHOL/HDL Ratio 10/15/2013 2   Final                  Men           Women1/2 Average Risk     3.4          3.3Average Risk          5.0          4.42X Average Risk          9.6          7.13X Average Risk          15.0          11.0                      . TSH 10/15/2013 0.59  0.35 - 5.50 uIU/mL Final     X-Rays:Dg Hip Complete Left  10/30/2013   CLINICAL DATA Osteoarthritis of the left hip.  EXAM LEFT HIP - COMPLETE 2+ VIEW o  COMPARISON None.  FINDINGS There is severe osteoarthritis of the left hip with cystic degenerative changes of the femoral head, superior joint space loss, sclerosis, and peripheral osteophytes on the femoral head.  The patient has a right total hip prosthesis in place.  IMPRESSION Severe osteoarthritis of the left hip.  SIGNATURE  Electronically Signed   By: Rozetta Nunnery M.D.   On: 10/30/2013 08:03   Dg Pelvis Portable  11/05/2013   CLINICAL DATA:  Postop left hip replacement  EXAM: PORTABLE PELVIS 1-2 VIEWS  COMPARISON:  05/19/2009  FINDINGS: Left hip replacement in satisfactory position and alignment. No fracture or acute complication.  Pre-existing right hip replacement also appears satisfactory.  IMPRESSION: Satisfactory left hip replacement.   Electronically Signed   By: Franchot Gallo M.D.   On: 11/05/2013 10:57   Dg C-arm 1-60 Min-no Report  11/05/2013   CLINICAL DATA: lt total hip replacement   C-ARM 1-60 MINUTES  Fluoroscopy was utilized by the requesting physician.  No radiographic  interpretation.     EKG: Orders placed in visit on 05/11/09  . [place] Hospital Course: Patient was admitted to [facility] and taken to the OR and underwent the above state procedure without complications.  Patient tolerated the procedure well and was later transferred to the recovery room and then to the orthopaedic floor for postoperative care.  They were given PO and IV analgesics for pain control following their surgery.  They were given 24 hours of postoperative antibiotics of  Anti-infectives   Start     Dose/Rate  Route Frequency Ordered Stop   11/05/13 1500  ceFAZolin (ANCEF) IVPB 1 g/50 mL premix     1 g 100 mL/hr over 30 Minutes Intravenous Every 6 hours 11/05/13 1232 11/05/13 2136   11/05/13 0712  ceFAZolin (ANCEF) IVPB 2 g/50 mL premix     2 g 100 mL/hr over 30 Minutes Intravenous On call to O.R. 11/05/13 8889 11/05/13 0836     and started on DVT prophylaxis in the form of Xarelto. PT and OT were ordered for total hip protocol. The patient was allowed to be WBAT with therapy. Discharge planning was consulted to help with postop disposition and equipment needs. Patient had a great night on the evening  of surgery. They started to get up OOB with therapy on day one. Patient was seen in rounds that morning and was doing very good. She had already been up walking the day of surgery and it was felt that she would be ready to go home later that day.  ADDENDUM -  Patient, while working with therapy, c/o "feeling bad", became pale and nauseous. Vagaled and vomited during therapy session.  Required fluid bolus and she was kept for monitoring.   She received fluids and was feeling better on day two.  Seen in rounds and then [place] following therapy.  Discharge home with home health  Diet - Regular diet  Follow up - in 2 weeks with Arlee Muslim, [facility] for Dr. Wynelle Link.  Activity - WBAT  Disposition - Home  Condition Upon Discharge - Good  D/C Meds - See DC Summary  DVT Prophylaxis - Xarelto    Discharge Orders   Future Orders Complete By Expires   Call MD / Call 911  As directed    Comments:     If you experience chest pain or shortness of breath, CALL 911 and be transported to the hospital emergency room.  If you develope a fever above 101 F, pus (white drainage) or increased drainage or redness at the wound, or calf pain, call your surgeon's office.   Change dressing  As directed    Comments:     You may change your dressing dressing daily with sterile 4 x 4 inch gauze dressing and paper tape.  Do not  submerge the incision under water.   Constipation Prevention  As directed    Comments:     Drink plenty of fluids.  Prune juice may be helpful.  You may use a stool softener, such as Colace (over the counter) 100 mg twice a day.  Use MiraLax (over the counter) for constipation as needed.   Diet general  As directed    Discharge instructions  As directed    Comments:     Pick up stool softner and laxative for home. Do not submerge incision under water. May shower. Continue to use ice for pain and swelling from surgery.  Total Hip Protocol.  Take Xarelto for two and a half more weeks, then discontinue Xarelto.   Do not sit on low chairs, stoools or toilet seats, as it may be difficult to get up from low surfaces  As directed    Driving restrictions  As directed    Comments:     No driving until released by the physician.   Increase activity slowly as tolerated  As directed    Lifting restrictions  As directed    Comments:     No lifting until released by the physician.   Patient may shower  As directed    Comments:     You may shower without a dressing once there is no drainage.  Do not wash over the wound.  If drainage remains, do not shower until drainage stops.   TED hose  As directed    Comments:     Use stockings (TED hose) for 3 weeks on both leg(s).  You may remove them at night for sleeping.   Weight bearing as tolerated  As directed    Questions:     Laterality:  left   Extremity:  Lower       Medication List    STOP taking these medications       ALEVE 220 MG  tablet  Generic drug:  naproxen sodium     PROBIOTIC DAILY PO     Vitamin D-3 5000 UNITS Tabs      TAKE these medications       celecoxib 200 MG capsule  Commonly known as:  CELEBREX  Take 200 mg by mouth daily.     methocarbamol 500 MG tablet  Commonly known as:  ROBAXIN  Take 1 tablet (500 mg total) by mouth every 6 (six) hours as needed for muscle spasms.     ondansetron 4 MG tablet  Commonly  known as:  ZOFRAN  Take 1 tablet (4 mg total) by mouth every 6 (six) hours as needed for nausea.     oxyCODONE 5 MG immediate release tablet  Commonly known as:  Oxy IR/ROXICODONE  Take 1-2 tablets (5-10 mg total) by mouth every 3 (three) hours as needed for moderate pain, severe pain or breakthrough pain.     polyethylene glycol packet  Commonly known as:  MIRALAX / GLYCOLAX  Take 17 g by mouth daily.     rivaroxaban 10 MG Tabs tablet  Commonly known as:  XARELTO  - Take 1 tablet (10 mg total) by mouth daily with breakfast. Take Xarelto for two and a half more weeks, then discontinue Xarelto.  - Once the patient has completed the blood thinner regimen, then take a Baby 81 mg Aspirin daily for three more weeks.     traMADol 50 MG tablet  Commonly known as:  ULTRAM  Take 1-2 tablets (50-100 mg total) by mouth every 6 (six) hours as needed (mild to moderate pain).     TYLENOL 500 MG tablet  Generic drug:  acetaminophen  Take 500 mg by mouth every 6 (six) hours as needed for mild pain or moderate pain.           Follow-up Information   Follow up with Mickel Crow, PA-C On 11/19/2013. (Call for appointment time and follow up with Arlee Muslim [facility] for Dr Wynelle Link.)    Specialty:  Orthopedic Surgery   Contact information:   [address] [place] [address] [place] [address] [phone]       Follow up with [place])    Contact information:   [address] [place] [address] [phone]       Signed: Mickel Crow 11/07/2013, 7:40 AM

## 2013-11-26 ENCOUNTER — Ambulatory Visit: Payer: 59 | Attending: Orthopedic Surgery | Admitting: Physical Therapy

## 2013-11-26 DIAGNOSIS — R5381 Other malaise: Secondary | ICD-10-CM | POA: Insufficient documentation

## 2013-11-26 DIAGNOSIS — M25669 Stiffness of unspecified knee, not elsewhere classified: Secondary | ICD-10-CM | POA: Insufficient documentation

## 2013-11-26 DIAGNOSIS — M25559 Pain in unspecified hip: Secondary | ICD-10-CM | POA: Insufficient documentation

## 2013-11-26 DIAGNOSIS — IMO0001 Reserved for inherently not codable concepts without codable children: Secondary | ICD-10-CM | POA: Insufficient documentation

## 2013-12-03 ENCOUNTER — Ambulatory Visit: Payer: 59 | Admitting: Physical Therapy

## 2013-12-10 ENCOUNTER — Ambulatory Visit: Payer: 59 | Admitting: Physical Therapy

## 2013-12-17 ENCOUNTER — Ambulatory Visit: Payer: 59 | Admitting: Physical Therapy

## 2014-06-05 ENCOUNTER — Other Ambulatory Visit: Payer: Self-pay

## 2015-01-05 ENCOUNTER — Other Ambulatory Visit: Payer: Self-pay | Admitting: Internal Medicine

## 2015-01-05 DIAGNOSIS — Z1231 Encounter for screening mammogram for malignant neoplasm of breast: Secondary | ICD-10-CM

## 2015-01-12 ENCOUNTER — Ambulatory Visit (HOSPITAL_COMMUNITY)
Admission: RE | Admit: 2015-01-12 | Discharge: 2015-01-12 | Disposition: A | Discharge status: Home / Self Care | Payer: 59 | Source: Ambulatory Visit | Attending: Internal Medicine | Admitting: Internal Medicine

## 2015-01-12 DIAGNOSIS — Z1231 Encounter for screening mammogram for malignant neoplasm of breast: Secondary | ICD-10-CM | POA: Insufficient documentation

## 2015-02-23 ENCOUNTER — Encounter: Payer: Self-pay | Admitting: Internal Medicine

## 2015-02-23 ENCOUNTER — Ambulatory Visit (INDEPENDENT_AMBULATORY_CARE_PROVIDER_SITE_OTHER): Payer: 59 | Admitting: Internal Medicine

## 2015-02-23 ENCOUNTER — Ambulatory Visit (INDEPENDENT_AMBULATORY_CARE_PROVIDER_SITE_OTHER)
Admission: RE | Admit: 2015-02-23 | Discharge: 2015-02-23 | Disposition: A | Discharge status: Home / Self Care | Payer: 59 | Source: Ambulatory Visit | Attending: Internal Medicine | Admitting: Internal Medicine

## 2015-02-23 VITALS — BP 110/66 | Temp 98.3°F | Wt 146.8 lb

## 2015-02-23 DIAGNOSIS — M79671 Pain in right foot: Secondary | ICD-10-CM

## 2015-02-23 MED ORDER — DICLOFENAC SODIUM 1 % TD GEL: 2.0000 g | Freq: Four times a day (QID) | TRANSDERMAL | Status: DC

## 2015-02-23 NOTE — Progress Notes (Signed)
Pre visit review using our clinic review tool, if applicable. No additional management support is needed unless otherwise documented below in the visit note.  Chief Complaint  Patient presents with  . Right Heel Pain    Ongoing since April.      HPI: Patient Katherine Rodriguez  comes in today for SDA for  new problem evaluation. Onset a couple months ago when visiting [place] no specific trauma that she had just walked too much pain at the base of the right heel and behind heel. He changed her shoes use Celebrex for a while without any significant help. She has a history of hip arthritis and surgery. No unusual fever. Has an appointment with Dr. Doran Durand but not until August and came in today to get further advice.  ROS: See pertinent positives and negatives per HPI.  Past Medical History  Diagnosis Date  . Endometriosis   . Abnormal Pap smear of anus   . PPD positive   . Recurrent HSV (herpes simplex virus)   . S/P hip replacement 08/01/2011  . History of herpes labialis 08/01/2011  . Arthritis   . GERD (gastroesophageal reflux disease) 2012    hx of, none recent  . Pneumonia 1994    hx of  . Anemia 2010    hx of  . History of blood transfusion 2010    no reaction    Family History  Problem Relation Age of Onset  . Hypertension Mother   . Arthritis Mother     spinal stenosis  . Lymphoma    . Other      bowel disease    History   Social History  . Marital Status: Married    Spouse Name: N/A  . Number of Children: N/A  . Years of Education: N/A   Social History Main Topics  . Smoking status: Former Smoker -- 15 years    Types: Cigarettes    Quit date: 08/21/1986  . Smokeless tobacco: Never Used  . Alcohol Use: Yes     Comment: rare  . Drug Use: No  . Sexual Activity: Not on file   Other Topics Concern  . None   Social History Narrative   Married   Regular exercise- yes   Cutting back on caffeine   Sleep 7    HH 3 pet dog and cat.    Works hospital  nursing   G4 P1     Outpatient Prescriptions Prior to Visit  Medication Sig Dispense Refill  . acetaminophen (TYLENOL) 500 MG tablet Take 500 mg by mouth every 6 (six) hours as needed for mild pain or moderate pain.     . polyethylene glycol (MIRALAX / GLYCOLAX) packet Take 17 g by mouth daily.    . celecoxib (CELEBREX) 200 MG capsule Take 200 mg by mouth daily.    . methocarbamol (ROBAXIN) 500 MG tablet Take 1 tablet (500 mg total) by mouth every 6 (six) hours as needed for muscle spasms. 80 tablet 0  . ondansetron (ZOFRAN) 4 MG tablet Take 1 tablet (4 mg total) by mouth every 6 (six) hours as needed for nausea. 40 tablet 0  . oxyCODONE (OXY IR/ROXICODONE) 5 MG immediate release tablet Take 1-2 tablets (5-10 mg total) by mouth every 3 (three) hours as needed for moderate pain, severe pain or breakthrough pain. 80 tablet 0  . rivaroxaban (XARELTO) 10 MG TABS tablet Take 1 tablet (10 mg total) by mouth daily with breakfast. Take Xarelto for two and a  half more weeks, then discontinue Xarelto. Once the patient has completed the blood thinner regimen, then take a Baby 81 mg Aspirin daily for three more weeks. 20 tablet 0  . traMADol (ULTRAM) 50 MG tablet Take 1-2 tablets (50-100 mg total) by mouth every 6 (six) hours as needed (mild to moderate pain). 60 tablet 1   No facility-administered medications prior to visit.     EXAM:  BP 110/66 mmHg  Temp(Src) 98.3 F (36.8 C) (Oral)  Wt 146 lb 12.8 oz (66.588 kg)  Body mass index is 24.82 kg/(m^2).  GENERAL: vitals reviewed and listed above, alert, oriented, appears well hydrated and in no acute distress HEENT: atraumatic, conjunctiva  clear, no obvious abnormalities on inspection of external nose and earsNECK: no obvious masses on inspection palpation  she is wearing sandals.  MS: moves all extremities without noticeable focal  abnormalityright heel no point tenderness negative squeeze there is some swelling in the retrocalcaneal area but no  warmth. Achilles is intact and not particularly tender at insertion there is no point tenderness of plantar fascii insertion gait almost normal.  PSYCH: pleasant and cooperative, no obvious depression or anxiety  ASSESSMENT AND PLAN:  Discussed the following assessment and plan:  Heel pain, right - Plan: DG Foot Complete Right  -Patient advised to return or notify health care team  if symptoms worsen ,persist or new concerns arise.  Patient Instructions  Acts like a retrocacaleaneal  bursitis    Can get x ray  Plain before seeing  Dr Doran Durand.  Keep appt with him.       [name]. Panosh M.D.

## 2015-02-23 NOTE — Patient Instructions (Addendum)
Acts like a retrocacaleaneal  bursitis    Can get x ray  Plain before seeing  Dr Doran Durand.  Keep appt with him.

## 2015-07-14 ENCOUNTER — Encounter: Payer: Self-pay | Admitting: Adult Health

## 2015-07-14 ENCOUNTER — Ambulatory Visit (INDEPENDENT_AMBULATORY_CARE_PROVIDER_SITE_OTHER): Payer: 59 | Admitting: Adult Health

## 2015-07-14 DIAGNOSIS — R509 Fever, unspecified: Secondary | ICD-10-CM

## 2015-07-14 DIAGNOSIS — H6592 Unspecified nonsuppurative otitis media, left ear: Secondary | ICD-10-CM | POA: Diagnosis not present

## 2015-07-14 DIAGNOSIS — R110 Nausea: Secondary | ICD-10-CM

## 2015-07-14 LAB — CBC WITH DIFFERENTIAL/PLATELET
BASOS ABS: 0 10*3/uL (ref 0.0–0.1)
BASOS PCT: 0 % (ref 0–1)
EOS ABS: 0 10*3/uL (ref 0.0–0.7)
Eosinophils Relative: 1 % (ref 0–5)
HCT: 40.3 % (ref 36.0–46.0)
Hemoglobin: 13.2 g/dL (ref 12.0–15.0)
Lymphocytes Relative: 21 % (ref 12–46)
Lymphs Abs: 0.9 10*3/uL (ref 0.7–4.0)
MCH: 29.7 pg (ref 26.0–34.0)
MCHC: 32.8 g/dL (ref 30.0–36.0)
MCV: 90.6 fL (ref 78.0–100.0)
MPV: 9.8 fL (ref 8.6–12.4)
Monocytes Absolute: 0.9 10*3/uL (ref 0.1–1.0)
Monocytes Relative: 20 % — ABNORMAL HIGH (ref 3–12)
Neutro Abs: 2.6 10*3/uL (ref 1.7–7.7)
Neutrophils Relative %: 58 % (ref 43–77)
PLATELETS: 212 10*3/uL (ref 150–400)
RBC: 4.45 MIL/uL (ref 3.87–5.11)
RDW: 13 % (ref 11.5–15.5)
WBC: 4.5 10*3/uL (ref 4.0–10.5)

## 2015-07-14 LAB — BASIC METABOLIC PANEL
BUN: 8 mg/dL (ref 7–25)
CO2: 30 mmol/L (ref 20–31)
CREATININE: 0.81 mg/dL (ref 0.50–0.99)
Calcium: 8.8 mg/dL (ref 8.6–10.4)
Chloride: 103 mmol/L (ref 98–110)
Glucose, Bld: 93 mg/dL (ref 65–99)
Potassium: 5 mmol/L (ref 3.5–5.3)
Sodium: 140 mmol/L (ref 135–146)

## 2015-07-14 LAB — POCT INFLUENZA A/B

## 2015-07-14 MED ORDER — AMOXICILLIN-POT CLAVULANATE 875-125 MG PO TABS: 1.0000 | ORAL_TABLET | Freq: Two times a day (BID) | ORAL | Status: DC

## 2015-07-14 MED ORDER — ONDANSETRON HCL 4 MG PO TABS: 4.0000 mg | ORAL_TABLET | Freq: Three times a day (TID) | ORAL | Status: DC | PRN

## 2015-07-14 NOTE — Patient Instructions (Addendum)
It was great meeting you today!  It appears as though you have an ear infection. I have sent in a prescription for Augmentin to the pharmacy. Start this tonight.   Continue to take one gram of tylenol every 8 hours and ibuprofen 500mg  in between doses. Stay well hydrated.   Use Zofran for nausea.   I will follow up with you regarding your blood work.

## 2015-07-14 NOTE — Progress Notes (Signed)
Pre visit review using our clinic review tool, if applicable. No additional management support is needed unless otherwise documented below in the visit note. 

## 2015-07-14 NOTE — Progress Notes (Signed)
Subjective:    Patient ID: Katherine Rodriguez, female    DOB: [birth year], 59 y.o.   MRN: [id number]  HPI   59 year old female who presents to the office today for less than 24 hours of fever greater than 101. Started yesterday afternoon with dry cough,  Headache,  muscle aches with a low grade fever of 99. It has progressed into the high grade fever with the highest record temp being 101.6. She has been taking tylenol 500mg  but is unable to get the fever to break.   She is also complaining of left sided ear pain but does not have any discharge.   She has nausea which first started a few hours ago. Denies vomiting or diarrhea. Has no sinus pain or pressure.  No known sick contacts and no recent travel.   Review of Systems  Constitutional: Positive for fever, chills, activity change, appetite change and fatigue. Negative for diaphoresis and unexpected weight change.  HENT: Positive for ear pain and facial swelling. Negative for congestion, ear discharge, nosebleeds, postnasal drip, rhinorrhea, sinus pressure, tinnitus and trouble swallowing.   Eyes: Negative.   Respiratory: Positive for cough. Negative for chest tightness and shortness of breath.   Cardiovascular: Negative.   Gastrointestinal: Positive for nausea. Negative for vomiting, abdominal pain, diarrhea, constipation, blood in stool, abdominal distention and anal bleeding.  Genitourinary: Negative.   Musculoskeletal: Positive for myalgias. Negative for joint swelling, arthralgias, neck pain and neck stiffness.  Skin: Negative.   Neurological: Negative.   All other systems reviewed and are negative.  Past Medical History  Diagnosis Date  . Endometriosis   . Abnormal Pap smear of anus   . PPD positive   . Recurrent HSV (herpes simplex virus)   . S/P hip replacement 08/01/2011  . History of herpes labialis 08/01/2011  . Arthritis   . GERD (gastroesophageal reflux disease) 2012    hx of, none recent  . Pneumonia 1994    hx of    . Anemia 2010    hx of  . History of blood transfusion 2010    no reaction    Social History   Social History  . Marital Status: Married    Spouse Name: N/A  . Number of Children: N/A  . Years of Education: N/A   Occupational History  . Not on file.   Social History Main Topics  . Smoking status: Former Smoker -- 15 years    Types: Cigarettes    Quit date: 08/21/1986  . Smokeless tobacco: Never Used  . Alcohol Use: Yes     Comment: rare  . Drug Use: No  . Sexual Activity: Not on file   Other Topics Concern  . Not on file   Social History Narrative   Married   Regular exercise- yes   Cutting back on caffeine   Sleep 7    HH 3 pet dog and cat.    Works hospital nursing   G4 P1     Past Surgical History  Procedure Laterality Date  . Thyroidectomy, partial  1973  . Laparotomy  1980's, 1991    exploratory, laser for endometriosis  . Joint replacement Right 2010    hip  . Total hip arthroplasty Left 11/05/2013    Procedure: LEFT TOTAL HIP ARTHROPLASTY ANTERIOR APPROACH;  Surgeon: Gearlean Alf, MD;  Location: [facility];  Service: Orthopedics;  Laterality: Left;    Family History  Problem Relation Age of Onset  . Hypertension Mother   .  Arthritis Mother     spinal stenosis  . Lymphoma    . Other      bowel disease    No Known Allergies  Current Outpatient Prescriptions on File Prior to Visit  Medication Sig Dispense Refill  . acetaminophen (TYLENOL) 500 MG tablet Take 500 mg by mouth every 6 (six) hours as needed for mild pain or moderate pain.     . Cholecalciferol (VITAMIN D3) 5000 UNITS CAPS Take by mouth. 1 CAPSULE THREE TIMES WEEKLY    . diclofenac sodium (VOLTAREN) 1 % GEL Apply 2 g topically 4 (four) times daily. 2 Tube 1  . naproxen sodium (ANAPROX) 220 MG tablet Take 220 mg by mouth 2 (two) times daily with a meal.    . polyethylene glycol (MIRALAX / GLYCOLAX) packet Take 17 g by mouth daily.    . valACYclovir (VALTREX) 1000 MG tablet Take 1,000  mg by mouth 2 (two) times daily. AS NEEDED FOR FEVER BLISTERS    . vitamin C (ASCORBIC ACID) 500 MG tablet Take 500 mg by mouth daily.     No current facility-administered medications on file prior to visit.    BP 92/60 mmHg  Pulse 97  Temp(Src) 101.6 F (38.7 C) (Oral)  Ht 5\' 4"  (1.626 m)  Wt 146 lb 1.6 oz (66.271 kg)  BMI 25.07 kg/m2  SpO2 97%       Objective:   Physical Exam  Constitutional: She is oriented to person, place, and time. She appears well-developed and well-nourished. No distress.  HENT:  Head: Normocephalic and atraumatic.  Right Ear: External ear normal.  Left Ear: External ear normal.  Nose: Nose normal.  Mouth/Throat: Oropharynx is clear and moist. No oropharyngeal exudate.  Red TM with effusion in left ear.  No signs of infection in right ear  Eyes: Conjunctivae and EOM are normal. Pupils are equal, round, and reactive to light. Right eye exhibits no discharge. Left eye exhibits no discharge.  Neck: No thyromegaly present.  Cardiovascular: Normal rate, regular rhythm, normal heart sounds and intact distal pulses.  Exam reveals no gallop and no friction rub.   No murmur heard. Pulmonary/Chest: Effort normal and breath sounds normal. No respiratory distress. She has no wheezes. She has no rales. She exhibits no tenderness.  Musculoskeletal: Normal range of motion. She exhibits no edema or tenderness.  Lymphadenopathy:    She has cervical adenopathy (left cervical ).  Neurological: She is alert and oriented to person, place, and time.  Skin: Skin is warm. No rash noted. She is not diaphoretic. No erythema. No pallor.  Moist skin  Psychiatric: She has a normal mood and affect. Her behavior is normal. Thought content normal.  Nursing note and vitals reviewed.      Assessment & Plan:  1. Otitis media with effusion, left - POC Influenza A/B - amoxicillin-clavulanate (AUGMENTIN) 875-125 MG tablet; Take 1 tablet by mouth 2 (two) times daily.  Dispense: 20  tablet; Refill: 0 - Basic metabolic panel - CBC with Differential/Platelet - Likely cause of her fever and generalized illness - Follow up if no improvement in 2-3 days.   2. Nausea without vomiting  - ondansetron (ZOFRAN) 4 MG tablet; Take 1 tablet (4 mg total) by mouth every 8 (eight) hours as needed for nausea or vomiting.  Dispense: 20 tablet; Refill: 2  3. Fever, unspecified - POC Influenza A/B - Basic metabolic panel - CBC with Differential/Platelet - Stay hydrated.  - 1 gm Tylenol every 8 hours and can  alternate with Ibuprofen.

## 2015-07-16 ENCOUNTER — Emergency Department
Admission: EM | Admit: 2015-07-16 | Discharge: 2015-07-16 | Disposition: A | Discharge status: Home / Self Care | Payer: 59 | Source: Home / Self Care | Attending: Family Medicine | Admitting: Family Medicine

## 2015-07-16 ENCOUNTER — Encounter: Payer: Self-pay | Admitting: Emergency Medicine

## 2015-07-16 DIAGNOSIS — J069 Acute upper respiratory infection, unspecified: Secondary | ICD-10-CM

## 2015-07-16 DIAGNOSIS — B9789 Other viral agents as the cause of diseases classified elsewhere: Principal | ICD-10-CM

## 2015-07-16 MED ORDER — BENZONATATE 200 MG PO CAPS: 200.0000 mg | ORAL_CAPSULE | Freq: Every day | ORAL | Status: DC

## 2015-07-16 NOTE — ED Notes (Signed)
Pt c/o fever, cough, body aches, dx w/ear infection on Wednesday, started on Augmentin, no better, head congestion

## 2015-07-16 NOTE — Discharge Instructions (Signed)
Continue and finish Augmentin. Take plain guaifenesin (1200mg  extended release tabs such as Mucinex) twice daily, with plenty of water, for cough and congestion.  May add Pseudoephedrine (30mg , one or two every 4 to 6 hours) for sinus congestion.  Get adequate rest.   May use Afrin nasal spray (or generic oxymetazoline) twice daily for about 5 days and then discontinue.  Also recommend using saline nasal spray several times daily and saline nasal irrigation (AYR is a common brand).  Try warm salt water gargles for sore throat.  Stop all antihistamines for now, and other non-prescription cough/cold preparations. May take Ibuprofen 200mg , 4 tabs every 8 hours with food for headache, fever, etc.   Follow-up with family doctor if not improving about 7 to10 days.

## 2015-07-16 NOTE — ED Provider Notes (Signed)
CSN: [id number]     Arrival date & time 07/16/15  1158 History   First MD Initiated Contact with Patient 07/16/15 1338     Chief Complaint  Patient presents with  . Cough      HPI Comments: Patient complains of three day history of typical cold-like symptoms including mild sore throat, sinus congestion, headache, and fatigue.  She had fever to 102.  Two days ago she was medically evaluated and a flu test was negative.  She was prescribed Augmentin for left ear infection.  Yesterday she developed a cough and increased sinus congestion.  She had mild shortness of breath last night, with tightness in her anterior chest.  The history is provided by the patient.    Past Medical History  Diagnosis Date  . Endometriosis   . Abnormal Pap smear of anus   . PPD positive   . Recurrent HSV (herpes simplex virus)   . S/P hip replacement 08/01/2011  . History of herpes labialis 08/01/2011  . Arthritis   . GERD (gastroesophageal reflux disease) 2012    hx of, none recent  . Pneumonia 1994    hx of  . Anemia 2010    hx of  . History of blood transfusion 2010    no reaction   Past Surgical History  Procedure Laterality Date  . Thyroidectomy, partial  1973  . Laparotomy  1980's, 1991    exploratory, laser for endometriosis  . Joint replacement Right 2010    hip  . Total hip arthroplasty Left 11/05/2013    Procedure: LEFT TOTAL HIP ARTHROPLASTY ANTERIOR APPROACH;  Surgeon: Gearlean Alf, MD;  Location: [facility];  Service: Orthopedics;  Laterality: Left;   Family History  Problem Relation Age of Onset  . Hypertension Mother   . Arthritis Mother     spinal stenosis  . Lymphoma    . Other      bowel disease   Social History  Substance Use Topics  . Smoking status: Former Smoker -- 15 years    Types: Cigarettes    Quit date: 08/21/1986  . Smokeless tobacco: Never Used  . Alcohol Use: Yes     Comment: rare   OB History    No data available     Review of Systems + sore throat +  cough No pleuritic pain No wheezing + nasal congestion + post-nasal drainage No sinus pain/pressure No itchy/red eyes ? earache No hemoptysis + SOB last night + fever, + chills No nausea No vomiting No abdominal pain No diarrhea No urinary symptoms No skin rash + fatigue + myalgias + headache Used OTC meds without relief   Allergies  Review of patient's allergies indicates no known allergies.  Home Medications   Prior to Admission medications   Medication Sig Start Date End Date Taking? Authorizing Provider  Ascorbic Acid (VITAMIN C) 1000 MG tablet Take 1,000 mg by mouth daily.   Yes Historical Provider, MD  amoxicillin-clavulanate (AUGMENTIN) 875-125 MG tablet Take 1 tablet by mouth 2 (two) times daily. 07/14/15   Dorothyann Peng, NP  benzonatate (TESSALON) 200 MG capsule Take 1 capsule (200 mg total) by mouth at bedtime. Take as needed for cough 07/16/15   Kandra Nicolas, MD  Cholecalciferol (VITAMIN D3) 5000 UNITS CAPS Take by mouth. [place]    Historical Provider, MD   Meds Ordered and Administered this Visit  Medications - No data to display  BP 94/61 mmHg  Pulse 74  Temp(Src)  99.4 F (37.4 C) (Oral)  Ht 5\' 4"  (1.626 m)  Wt 145 lb (65.772 kg)  BMI 24.88 kg/m2  SpO2 98% No data found.   Physical Exam Nursing notes and Vital Signs reviewed. Appearance:  Patient appears stated age, and in no acute distress Eyes:  Pupils are equal, round, and reactive to light and accomodation.  Extraocular movement is intact.  Conjunctivae are not inflamed  Ears:  Canals normal.  Tympanic membranes normal.  Nose:  Mildly congested turbinates.  No sinus tenderness.   Pharynx:  Normal Neck:  Supple.  Tender enlarged posterior nodes are palpated bilaterally  Lungs:  Clear to auscultation.  Breath sounds are equal.  Moving air well. Heart:  Regular rate and rhythm without murmurs, rubs, or gallops.  Abdomen:  Nontender without masses or hepatosplenomegaly.   Bowel sounds are present.  No CVA or flank tenderness.  Extremities:  No edema.  No calf tenderness Skin:  No rash present.   ED Course  Procedures none   MDM   1. Viral URI with cough    Prescription written for Benzonatate (Tessalon) to take at bedtime for night-time cough.  Continue and finish Augmentin. Take plain guaifenesin (1200mg  extended release tabs such as Mucinex) twice daily, with plenty of water, for cough and congestion.  May add Pseudoephedrine (30mg , one or two every 4 to 6 hours) for sinus congestion.  Get adequate rest.   May use Afrin nasal spray (or generic oxymetazoline) twice daily for about 5 days and then discontinue.  Also recommend using saline nasal spray several times daily and saline nasal irrigation (AYR is a common brand).  Try warm salt water gargles for sore throat.  Stop all antihistamines for now, and other non-prescription cough/cold preparations. May take Ibuprofen 200mg , 4 tabs every 8 hours with food for headache, fever, etc.   Follow-up with family doctor if not improving about 7 to10 days.    Kandra Nicolas, MD 07/21/15 [phone]

## 2016-04-19 DIAGNOSIS — Z0100 Encounter for examination of eyes and vision without abnormal findings: Secondary | ICD-10-CM | POA: Diagnosis not present

## 2016-05-02 ENCOUNTER — Other Ambulatory Visit: Payer: Self-pay | Admitting: Internal Medicine

## 2016-05-02 DIAGNOSIS — Z1231 Encounter for screening mammogram for malignant neoplasm of breast: Secondary | ICD-10-CM

## 2016-05-11 ENCOUNTER — Ambulatory Visit
Admission: RE | Admit: 2016-05-11 | Discharge: 2016-05-11 | Disposition: A | Discharge status: Home / Self Care | Payer: 59 | Source: Ambulatory Visit | Attending: Internal Medicine | Admitting: Internal Medicine

## 2016-05-11 DIAGNOSIS — Z1231 Encounter for screening mammogram for malignant neoplasm of breast: Secondary | ICD-10-CM | POA: Diagnosis not present

## 2016-07-21 DIAGNOSIS — Z6825 Body mass index (BMI) 25.0-25.9, adult: Secondary | ICD-10-CM | POA: Diagnosis not present

## 2016-07-21 DIAGNOSIS — Z01419 Encounter for gynecological examination (general) (routine) without abnormal findings: Secondary | ICD-10-CM | POA: Diagnosis not present

## 2016-08-11 MED FILL — AMOXICILLIN 500 MG CAPSULE: 500 | 6 days supply | Qty: 25 | Fill #0

## 2016-08-11 MED FILL — TRAMADOL-ACETAMINOPHN 37.5-: 37.5-325 | 2 days supply | Qty: 16 | Fill #0

## 2016-08-23 ENCOUNTER — Other Ambulatory Visit (INDEPENDENT_AMBULATORY_CARE_PROVIDER_SITE_OTHER): Payer: 59

## 2016-08-23 DIAGNOSIS — Z0000 Encounter for general adult medical examination without abnormal findings: Secondary | ICD-10-CM

## 2016-08-23 LAB — LIPID PANEL
CHOL/HDL RATIO: 3
Cholesterol: 198 mg/dL (ref 0–200)
HDL: 68.5 mg/dL (ref >39.00)
LDL CALC: 110 mg/dL — AB (ref 0–99)
NonHDL: 129.83
TRIGLYCERIDES: 98 mg/dL (ref 0.0–149.0)
VLDL: 19.6 mg/dL (ref 0.0–40.0)

## 2016-08-23 LAB — HEPATIC FUNCTION PANEL
ALK PHOS: 75 U/L (ref 39–117)
ALT: 32 U/L (ref 0–35)
AST: 26 U/L (ref 0–37)
Albumin: 4.5 g/dL (ref 3.5–5.2)
BILIRUBIN DIRECT: 0.1 mg/dL (ref 0.0–0.3)
BILIRUBIN TOTAL: 0.7 mg/dL (ref 0.2–1.2)
Total Protein: 6.8 g/dL (ref 6.0–8.3)

## 2016-08-23 LAB — CBC WITH DIFFERENTIAL/PLATELET
BASOS ABS: 0 10*3/uL (ref 0.0–0.1)
BASOS PCT: 0.4 % (ref 0.0–3.0)
EOS ABS: 0.1 10*3/uL (ref 0.0–0.7)
Eosinophils Relative: 1.9 % (ref 0.0–5.0)
HCT: 40.6 % (ref 36.0–46.0)
HEMOGLOBIN: 13.8 g/dL (ref 12.0–15.0)
Lymphocytes Relative: 29.9 % (ref 12.0–46.0)
Lymphs Abs: 1.7 10*3/uL (ref 0.7–4.0)
MCHC: 34 g/dL (ref 30.0–36.0)
MCV: 91.2 fl (ref 78.0–100.0)
MONO ABS: 0.5 10*3/uL (ref 0.1–1.0)
Monocytes Relative: 9.4 % (ref 3.0–12.0)
Neutro Abs: 3.4 10*3/uL (ref 1.4–7.7)
Neutrophils Relative %: 58.4 % (ref 43.0–77.0)
PLATELETS: 239 10*3/uL (ref 150.0–400.0)
RBC: 4.45 Mil/uL (ref 3.87–5.11)
RDW: 13.8 % (ref 11.5–15.5)
WBC: 5.8 10*3/uL (ref 4.0–10.5)

## 2016-08-23 LAB — BASIC METABOLIC PANEL
BUN: 16 mg/dL (ref 6–23)
CALCIUM: 9.3 mg/dL (ref 8.4–10.5)
CHLORIDE: 103 meq/L (ref 96–112)
CO2: 32 mEq/L (ref 19–32)
CREATININE: 0.76 mg/dL (ref 0.40–1.20)
GFR: 81.94 mL/min (ref >60.00)
Glucose, Bld: 94 mg/dL (ref 70–99)
Potassium: 4.4 mEq/L (ref 3.5–5.1)
Sodium: 140 mEq/L (ref 135–145)

## 2016-08-23 LAB — TSH: TSH: 3.71 u[IU]/mL (ref 0.35–4.50)

## 2016-08-27 NOTE — Progress Notes (Signed)
Pre visit review using our clinic review tool, if applicable. No additional management support is needed unless otherwise documented below in the visit note.  Chief Complaint  Patient presents with  . Annual Exam    HPI: Patient  Katherine Rodriguez  61 y.o. comes in today for [facility] visit  Generally well no major changes in health stress with family illness. Would like a refill of Valtrex for fever blisters. Taking vitamin D not calcium.  Health Maintenance  Topic Date Due  . Hepatitis C Screening  08/02/1956  . HIV Screening  07/22/1969  . MAMMOGRAM  05/11/2018  . PAP SMEAR  07/21/2018  . TETANUS/TDAP  05/12/2019  . COLONOSCOPY  11/05/2021  . INFLUENZA VACCINE  Addressed  . ZOSTAVAX  Addressed   Health Maintenance Review LIFESTYLE:  Exercise:  Had been better    Tobacco/ETS: no Alcohol:  rare Sugar beverages: no Sleep: average 7.5  Drug use: no  HH of 2 1 cat  Work: .9   Average 36 hours  Day evening .   Surgery center. Mom alzheimers    Into assisted living and father 99      ROS:  GEN/ HEENT: No fever, significant weight changes sweats headaches vision problems hearing changes, CV/ PULM; No chest pain shortness of breath cough, syncope,edema  change in exercise tolerance. GI /GU: No adominal pain, vomiting, change in bowel habits. No blood in the stool. No significant GU symptoms. SKIN/HEME: ,no acute skin rashes suspicious lesions or bleeding. No lymphadenopathy, nodules, masses.  NEURO/ PSYCH:  No neurologic signs such as weakness numbness. No depression anxiety. IMM/ Allergy: No unusual infections.  Allergy .   REST of 12 system review negative except as per HPI   Past Medical History:  Diagnosis Date  . Abnormal Pap smear of anus   . Anemia 2010   hx of  . Arthritis   . Endometriosis   . GERD (gastroesophageal reflux disease) 2012   hx of, none recent  . History of blood transfusion 2010   no reaction  . History of herpes labialis  08/01/2011  . Periumbilical abdominal pain 03/11/2013   doubt uti but cx pending consdier small umbi hernia . refer  options of eval discussed    . Pneumonia 1994   hx of  . PPD positive   . Recurrent HSV (herpes simplex virus)   . S/P hip replacement 08/01/2011    Past Surgical History:  Procedure Laterality Date  . JOINT REPLACEMENT Right 2010   hip  . LAPAROTOMY  1980's, 1991   exploratory, laser for endometriosis  . THYROIDECTOMY, PARTIAL  1973  . TOTAL HIP ARTHROPLASTY Left 11/05/2013   Procedure: LEFT TOTAL HIP ARTHROPLASTY ANTERIOR APPROACH;  Surgeon: Gearlean Alf, MD;  Location: [facility];  Service: Orthopedics;  Laterality: Left;   Mom was adopted  Father  dvt hx protaste cancer  afibe 71 and well  8 sibs also in good health  Family History  Problem Relation Age of Onset  . Hypertension Mother   . Arthritis Mother     spinal stenosis  . Lymphoma    . Other      bowel disease    Social History   Social History  . Marital status: Married    Spouse name: N/A  . Number of children: N/A  . Years of education: N/A   Social History Main Topics  . Smoking status: Former Smoker    Years: 15.00    Types: Cigarettes  Quit date: 08/21/1986  . Smokeless tobacco: Never Used  . Alcohol use Yes     Comment: rare  . Drug use: No  . Sexual activity: Not Asked   Other Topics Concern  . None   Social History Narrative   Married   Regular exercise- yes   Cutting back on caffeine   Sleep 7    HH 3 pet dog and cat.    Works hospital nursing   G4 P1     Outpatient Medications Prior to Visit  Medication Sig Dispense Refill  . Cholecalciferol (VITAMIN D3) 5000 UNITS CAPS Take by mouth. 1 CAPSULE THREE TIMES WEEKLY    . Ascorbic Acid (VITAMIN C) 1000 MG tablet Take 1,000 mg by mouth daily.    Marland Kitchen amoxicillin-clavulanate (AUGMENTIN) 875-125 MG tablet Take 1 tablet by mouth 2 (two) times daily. 20 tablet 0  . benzonatate (TESSALON) 200 MG capsule Take 1 capsule (200 mg  total) by mouth at bedtime. Take as needed for cough 12 capsule 0   No facility-administered medications prior to visit.      EXAM:  BP 110/70 (BP Location: Left Arm, Patient Position: Sitting, Cuff Size: Normal)   Temp 98.1 F (36.7 C) (Oral)   Ht 5\' 4"  (1.626 m)   Wt 146 lb (66.2 kg)   BMI 25.06 kg/m   Body mass index is 25.06 kg/m.  Physical Exam: Vital signs reviewed [id number] is a well-developed well-nourished alert cooperative    who appearsr stated age in no acute distress.  HEENT: normocephalic atraumatic , Eyes: PERRL EOM's full, conjunctiva clear, Nares: paten,t no deformity discharge or tenderness., Ears: no deformity EAC's clear TMs with normal landmarks. Mouth: clear OP, no lesions, edema.  Moist mucous membranes. Dentition in adequate repair. NECK: supple without masses, thyromegaly or bruits. CHEST/PULM:  Clear to auscultation and percussion breath sounds equal no wheeze , rales or rhonchi. No chest wall deformities or tenderness. Breast no nodules or discharge. Normal by inspection. CV: PMI is nondisplaced, S1 S2 no gallops, murmurs, rubs. Peripheral pulses are full without delay.No JVD .  ABDOMEN: Bowel sounds normal nontender  No guard or rebound, no hepato splenomegal no CVA tenderness.  No hernia. Extremtities:  No clubbing cyanosis or edema, no acute joint swelling or redness no focal atrophy NEURO:  Oriented x3, cranial nerves 3-12 appear to be intact, no obvious focal weakness,gait within normal limits no abnormal reflexes or asymmetrical SKIN: No acute rashes normal turgor, color, no bruising or petechiae. PSYCH: Oriented, good eye contact, no obvious depression anxiety, cognition and judgment appear normal. LN: no cervical axillary inguinal adenopathy  Lab Results  Component Value Date   WBC 5.8 08/23/2016   HGB 13.8 08/23/2016   HCT 40.6 08/23/2016   PLT 239.0 08/23/2016   GLUCOSE 94 08/23/2016   CHOL 198 08/23/2016   TRIG 98.0 08/23/2016   HDL 68.50  08/23/2016   LDLCALC 110 (H) 08/23/2016   ALT 32 08/23/2016   AST 26 08/23/2016   NA 140 08/23/2016   K 4.4 08/23/2016   CL 103 08/23/2016   CREATININE 0.76 08/23/2016   BUN 16 08/23/2016   CO2 32 08/23/2016   TSH 3.71 08/23/2016   INR 0.95 10/28/2013    ASSESSMENT AND PLAN:  Discussed the following assessment and plan:  Visit for preventive health examination  Osteopenia, unspecified location - Plan: DG Bone Density  Estrogen deficiency - Plan: DG Bone Density  History of herpes labialis - refill med as needed Family hx of  alzhiemers  Mom 25   .  Family history of osteoporosis mother broke her hip. Long-lived family. Negative significant for vascular disease. No need to do HIV and HCV screen as she has had blood donation in the last year or so she had blood transfusion in 2010 moderate times. Counseled. Preventive measures  Patient Care Team: Burnis Medin, MD as PCP - General Laurence Spates, MD (Gastroenterology) Avon Gully, NP as Nurse Practitioner (Obstetrics and Gynecology) Gaynelle Arabian, MD as Consulting Physician (Orthopedic Surgery) Patient Instructions  Continue lifestyle intervention healthy eating and exercise .  Walking aerobic exercise blood pressure control avoiding head trauma and getting enough sleep or what we know about preventing Alzheimer or cognitive decline. Would advise  blood pressure readings to be in the 120/80 range.  Dex 2012 t score -1.9  Mild osteopenia   Repeat  Dexa to look at stbility and continue vit d  And weight bearing exercises.    Bone Health Introduction Bones protect organs, store calcium, and anchor muscles. Good health habits, such as eating nutritious foods and exercising regularly, are important for maintaining healthy bones. They can also help to prevent a condition that causes bones to lose density and become weak and brittle (osteoporosis). Why is bone mass important? Bone mass refers to the amount of bone tissue  that you have. The higher your bone mass, the stronger your bones. An important step toward having healthy bones throughout life is to have strong and dense bones during childhood. A young adult who has a high bone mass is more likely to have a high bone mass later in life. Bone mass at its greatest it is called peak bone mass. A large decline in bone mass occurs in older adults. In women, it occurs about the time of menopause. During this time, it is important to practice good health habits, because if more bone is lost than what is replaced, the bones will become less healthy and more likely to break (fracture). If you find that you have a low bone mass, you may be able to prevent osteoporosis or further bone loss by changing your diet and lifestyle. How can I find out if my bone mass is low? Bone mass can be measured with an X-ray test that is called a bone mineral density (BMD) test. This test is recommended for all women who are age 74 or older. It may also be recommended for men who are age 45 or older, or for people who are more likely to develop osteoporosis due to:  Having bones that break easily.  Having a long-term disease that weakens bones, such as kidney disease or rheumatoid arthritis.  Having menopause earlier than normal.  Taking medicine that weakens bones, such as steroids, thyroid hormones, or hormone treatment for breast cancer or prostate cancer.  Smoking.  Drinking three or more alcoholic drinks each day. What are the nutritional recommendations for healthy bones? To have healthy bones, you need to get enough of the right minerals and vitamins. Most nutrition experts recommend getting these nutrients from the foods that you eat. Nutritional recommendations vary from person to person. Ask your health care provider what is healthy for you. Here are some general guidelines. Calcium Recommendations  Calcium is the most important (essential) mineral for bone health. Most people  can get enough calcium from their diet, but supplements may be recommended for people who are at risk for osteoporosis. Good sources of calcium include:  Dairy products, such as low-fat or nonfat  milk, cheese, and yogurt.  Dark green leafy vegetables, such as bok choy and broccoli.  Calcium-fortified foods, such as orange juice, cereal, bread, soy beverages, and tofu products.  Nuts, such as almonds. Follow these recommended amounts for daily calcium intake:  Children, age 26?3: 700 mg.  Children, age 65?8: 1,000 mg.  Children, age 25?13: 1,300 mg.  Teens, age 31?18: 1,300 mg.  Adults, age 37?50: 1,000 mg.  Adults, age 59?70:  Men: 1,000 mg.  Women: 1,200 mg.  Adults, age 657 or older: 1,200 mg.  Pregnant and breastfeeding females:  Teens: 1,300 mg.  Adults: 1,000 mg. Vitamin D Recommendations  Vitamin D is the most essential vitamin for bone health. It helps the body to absorb calcium. Sunlight stimulates the skin to make vitamin D, so be sure to get enough sunlight. If you live in a cold climate or you do not get outside often, your health care provider may recommend that you take vitamin D supplements. Good sources of vitamin D in your diet include:  Egg yolks.  Saltwater fish.  Milk and cereal fortified with vitamin D. Follow these recommended amounts for daily vitamin D intake:  Children and teens, age 652?18: 31 international units.  Adults, age 79 or younger: 400-800 international units.  Adults, age 59 or older: 800-1,000 international units. Other Nutrients  Other nutrients for bone health include:  Phosphorus. This mineral is found in meat, poultry, dairy foods, nuts, and legumes. The recommended daily intake for adult men and adult women is 700 mg.  Magnesium. This mineral is found in seeds, nuts, dark green vegetables, and legumes. The recommended daily intake for adult men is 400?420 mg. For adult women, it is 310?320 mg.  Vitamin K. This vitamin is  found in green leafy vegetables. The recommended daily intake is 120 mg for adult men and 90 mg for adult women. What type of physical activity is best for building and maintaining healthy bones? Weight-bearing and strength-building activities are important for building and maintaining peak bone mass. Weight-bearing activities cause muscles and bones to work against gravity. Strength-building activities increases muscle strength that supports bones. Weight-bearing and muscle-building activities include:  Walking and hiking.  Jogging and running.  Dancing.  Gym exercises.  Lifting weights.  Tennis and racquetball.  Climbing stairs.  Aerobics. Adults should get at least 30 minutes of moderate physical activity on most days. Children should get at least 60 minutes of moderate physical activity on most days. Ask your health care provide what type of exercise is best for you. Where can I find more information? For more information, check out the following websites:  [place]: [employer]se  [employer] of Health: http://www.niams.AnonymousEar.fr.asp This information is not intended to replace advice given to you by your health care provider. Make sure you discuss any questions you have with your health care provider. Document Released: 10/28/2003 Document Revised: 02/25/2016 Document Reviewed: 08/12/2014  2017 Elsevier    Mariann Laster K. Panosh M.D.

## 2016-08-29 ENCOUNTER — Encounter: Payer: Self-pay | Admitting: Internal Medicine

## 2016-08-29 ENCOUNTER — Ambulatory Visit (INDEPENDENT_AMBULATORY_CARE_PROVIDER_SITE_OTHER): Payer: 59 | Admitting: Internal Medicine

## 2016-08-29 VITALS — BP 110/70 | Temp 98.1°F | Ht 64.0 in | Wt 146.0 lb

## 2016-08-29 DIAGNOSIS — E2839 Other primary ovarian failure: Secondary | ICD-10-CM | POA: Diagnosis not present

## 2016-08-29 DIAGNOSIS — M8580 Other specified disorders of bone density and structure, unspecified site: Secondary | ICD-10-CM | POA: Diagnosis not present

## 2016-08-29 DIAGNOSIS — Z8619 Personal history of other infectious and parasitic diseases: Secondary | ICD-10-CM

## 2016-08-29 DIAGNOSIS — Z0000 Encounter for general adult medical examination without abnormal findings: Secondary | ICD-10-CM | POA: Diagnosis not present

## 2016-08-29 MED ORDER — VALACYCLOVIR HCL 1 G PO TABS: 2000.0000 mg | ORAL_TABLET | Freq: Two times a day (BID) | ORAL | 11 refills | Status: DC

## 2016-08-29 MED FILL — valACYclovir HCL 1 GM TABS: 1 | 7 days supply | Qty: 30 | Fill #0

## 2016-08-29 NOTE — Patient Instructions (Addendum)
Continue lifestyle intervention healthy eating and exercise .  Walking aerobic exercise blood pressure control avoiding head trauma and getting enough sleep or what we know about preventing Alzheimer or cognitive decline. Would advise  blood pressure readings to be in the 120/80 range.  Dex 2012 t score -1.9  Mild osteopenia   Repeat  Dexa to look at stbility and continue vit d  And weight bearing exercises.    Bone Health Introduction Bones protect organs, store calcium, and anchor muscles. Good health habits, such as eating nutritious foods and exercising regularly, are important for maintaining healthy bones. They can also help to prevent a condition that causes bones to lose density and become weak and brittle (osteoporosis). Why is bone mass important? Bone mass refers to the amount of bone tissue that you have. The higher your bone mass, the stronger your bones. An important step toward having healthy bones throughout life is to have strong and dense bones during childhood. A young adult who has a high bone mass is more likely to have a high bone mass later in life. Bone mass at its greatest it is called peak bone mass. A large decline in bone mass occurs in older adults. In women, it occurs about the time of menopause. During this time, it is important to practice good health habits, because if more bone is lost than what is replaced, the bones will become less healthy and more likely to break (fracture). If you find that you have a low bone mass, you may be able to prevent osteoporosis or further bone loss by changing your diet and lifestyle. How can I find out if my bone mass is low? Bone mass can be measured with an X-ray test that is called a bone mineral density (BMD) test. This test is recommended for all women who are age 60 or older. It may also be recommended for men who are age 267 or older, or for people who are more likely to develop osteoporosis due to:  Having bones that break  easily.  Having a long-term disease that weakens bones, such as kidney disease or rheumatoid arthritis.  Having menopause earlier than normal.  Taking medicine that weakens bones, such as steroids, thyroid hormones, or hormone treatment for breast cancer or prostate cancer.  Smoking.  Drinking three or more alcoholic drinks each day. What are the nutritional recommendations for healthy bones? To have healthy bones, you need to get enough of the right minerals and vitamins. Most nutrition experts recommend getting these nutrients from the foods that you eat. Nutritional recommendations vary from person to person. Ask your health care provider what is healthy for you. Here are some general guidelines. Calcium Recommendations  Calcium is the most important (essential) mineral for bone health. Most people can get enough calcium from their diet, but supplements may be recommended for people who are at risk for osteoporosis. Good sources of calcium include:  Dairy products, such as low-fat or nonfat milk, cheese, and yogurt.  Dark green leafy vegetables, such as bok choy and broccoli.  Calcium-fortified foods, such as orange juice, cereal, bread, soy beverages, and tofu products.  Nuts, such as almonds. Follow these recommended amounts for daily calcium intake:  Children, age 55?3: 700 mg.  Children, age 26?8: 1,000 mg.  Children, age 26?13: 1,300 mg.  Teens, age 72?18: 1,300 mg.  Adults, age 261?50: 1,000 mg.  Adults, age 82?70:  Men: 1,000 mg.  Women: 1,200 mg.  Adults, age 6 or older: 34,200  mg.  Pregnant and breastfeeding females:  Teens: 1,300 mg.  Adults: 1,000 mg. Vitamin D Recommendations  Vitamin D is the most essential vitamin for bone health. It helps the body to absorb calcium. Sunlight stimulates the skin to make vitamin D, so be sure to get enough sunlight. If you live in a cold climate or you do not get outside often, your health care provider may recommend that  you take vitamin D supplements. Good sources of vitamin D in your diet include:  Egg yolks.  Saltwater fish.  Milk and cereal fortified with vitamin D. Follow these recommended amounts for daily vitamin D intake:  Children and teens, age 86?18: 8 international units.  Adults, age 42 or younger: 400-800 international units.  Adults, age 85 or older: 800-1,000 international units. Other Nutrients  Other nutrients for bone health include:  Phosphorus. This mineral is found in meat, poultry, dairy foods, nuts, and legumes. The recommended daily intake for adult men and adult women is 700 mg.  Magnesium. This mineral is found in seeds, nuts, dark green vegetables, and legumes. The recommended daily intake for adult men is 400?420 mg. For adult women, it is 310?320 mg.  Vitamin K. This vitamin is found in green leafy vegetables. The recommended daily intake is 120 mg for adult men and 90 mg for adult women. What type of physical activity is best for building and maintaining healthy bones? Weight-bearing and strength-building activities are important for building and maintaining peak bone mass. Weight-bearing activities cause muscles and bones to work against gravity. Strength-building activities increases muscle strength that supports bones. Weight-bearing and muscle-building activities include:  Walking and hiking.  Jogging and running.  Dancing.  Gym exercises.  Lifting weights.  Tennis and racquetball.  Climbing stairs.  Aerobics. Adults should get at least 30 minutes of moderate physical activity on most days. Children should get at least 60 minutes of moderate physical activity on most days. Ask your health care provide what type of exercise is best for you. Where can I find more information? For more information, check out the following websites:  [place]: [employer]se  [employer] of Health:  http://www.niams.AnonymousEar.fr.asp This information is not intended to replace advice given to you by your health care provider. Make sure you discuss any questions you have with your health care provider. Document Released: 10/28/2003 Document Revised: 02/25/2016 Document Reviewed: 08/12/2014  2017 Elsevier

## 2016-09-04 ENCOUNTER — Other Ambulatory Visit: Payer: 59

## 2016-12-04 MED FILL — valACYclovir HCL 1 GM TABS: 1 | 7 days supply | Qty: 30 | Fill #1

## 2016-12-05 ENCOUNTER — Encounter: Payer: Self-pay | Admitting: Sports Medicine

## 2016-12-05 ENCOUNTER — Ambulatory Visit (INDEPENDENT_AMBULATORY_CARE_PROVIDER_SITE_OTHER): Payer: 59 | Admitting: Sports Medicine

## 2016-12-05 DIAGNOSIS — M79675 Pain in left toe(s): Secondary | ICD-10-CM

## 2016-12-05 DIAGNOSIS — L600 Ingrowing nail: Secondary | ICD-10-CM | POA: Diagnosis not present

## 2016-12-05 NOTE — Patient Instructions (Signed)

## 2016-12-05 NOTE — Progress Notes (Signed)
Subjective: Katherine Rodriguez is a 61 y.o. female patient presents to office today complaining of a painful incurvated, red, swollen medial nail border of the 1st toe on the left foot. This has been present for 4 days. Patient has treated this by soaking. Patient denies fever/chills/nausea/vomitting/any other related constitutional symptoms at this time.  Patient Active Problem List   Diagnosis Date Noted  . OA (osteoarthritis) of hip 11/05/2013  . Hip arthritis 10/15/2013  . S/P hip replacement 08/01/2011  . Visit for preventive health examination 08/01/2011  . Osteopenia 08/01/2011  . History of herpes labialis 08/01/2011  . ARTHRITIS, RIGHT HIP 05/11/2009  . TACHYCARDIA 01/20/2008  . GERD 10/08/2007    Current Outpatient Prescriptions on File Prior to Visit  Medication Sig Dispense Refill  . Cholecalciferol (VITAMIN D3) 5000 UNITS CAPS Take by mouth. 1 CAPSULE THREE TIMES WEEKLY    . fluticasone (FLONASE) 50 MCG/ACT nasal spray Place 1 spray into both nostrils daily.    . valACYclovir (VALTREX) 1000 MG tablet Take 2 tablets (2,000 mg total) by mouth 2 (two) times daily. AS NEEDED FOR FEVER BLISTERS 30 tablet 11  . vitamin C (ASCORBIC ACID) 500 MG tablet Take 500 mg three times weekly     No current facility-administered medications on file prior to visit.     No Known Allergies  Objective:  There were no vitals filed for this visit.  General: Well developed, nourished, in no acute distress, alert and oriented x3   Dermatology: Skin is warm, dry and supple bilateral. Left hallux nail appears to be moderately incurvated with hyperkeratosis formation at the distal aspects of the medial nail border. (+) Focal Erythema. (+) Edema. (-) serosanguous drainage present. The remaining nails appear unremarkable at this time. There are no open sores, lesions or other signs of infection  present.  Vascular: Dorsalis Pedis artery and Posterior Tibial artery pedal pulses are 2/4 bilateral with  immedate capillary fill time. Pedal hair growth present. No lower extremity edema.   Neruologic: Grossly intact via light touch bilateral.  Musculoskeletal: Tenderness to palpation of the Left hallux medial nail fold(s). Muscular strength within normal limits in all groups bilateral.   Assesement and Plan: Problem List Items Addressed This Visit    None    Visit Diagnoses    Ingrown nail    -  Primary   Toe pain, left       medial     -Discussed treatment alternatives and plan of care; Explained permanent/temporary nail avulsion and post procedure course to patient. - After a verbal consent, injected 3 ml of a 50:50 mixture of 2% plain  lidocaine and 0.5% plain marcaine in a normal hallux block fashion. Next, a  betadine prep was performed. Anesthesia was tested and found to be appropriate.  The offending Left hallux medial nail border was then incised from the hyponychium to the epinychium. The offending nail border was removed and cleared from the field. The area was curretted for any remaining nail or spicules. Phenol application performed and the area was then flushed with alcohol and dressed with antibiotic cream and a dry sterile dressing. -Patient was instructed to leave the dressing intact for today and begin soaking in a weak solution of betadine or Epsom salt and water tomorrow. Patient was instructed to soak for 15 minutes each day and apply neosporin and a gauze or bandaid dressing each day. -Patient was instructed to monitor the toe for signs of infection and return to office if toe becomes  red, hot or swollen. -Advised ice, elevation, and tylenol or motrin if needed for pain.  -Patient is to return in 2 weeks for follow up care/nail check or sooner if problems arise.  Landis Martins, DPM

## 2016-12-12 ENCOUNTER — Encounter: Payer: Self-pay | Admitting: Sports Medicine

## 2016-12-12 ENCOUNTER — Telehealth: Payer: Self-pay | Admitting: *Deleted

## 2016-12-12 ENCOUNTER — Ambulatory Visit (INDEPENDENT_AMBULATORY_CARE_PROVIDER_SITE_OTHER): Payer: Self-pay | Admitting: Sports Medicine

## 2016-12-12 VITALS — BP 127/71 | HR 58 | Resp 16

## 2016-12-12 DIAGNOSIS — M79675 Pain in left toe(s): Secondary | ICD-10-CM

## 2016-12-12 DIAGNOSIS — Z9889 Other specified postprocedural states: Secondary | ICD-10-CM

## 2016-12-12 MED ORDER — AMOXICILLIN-POT CLAVULANATE 875-125 MG PO TABS: 1.0000 | ORAL_TABLET | Freq: Two times a day (BID) | ORAL | 0 refills | Status: DC

## 2016-12-12 MED FILL — AMOX TR-K CLV 875-125 MG TA: 875-125 | 14 days supply | Qty: 28 | Fill #0

## 2016-12-12 NOTE — Progress Notes (Signed)
Subjective: Katherine Rodriguez is a 61 y.o. female patient returns to office today for follow up evaluation after having Left Hallux medial permanent nail avulsion performed on 12-05-16. Patient has been soaking using epsom salt and applying topical antibiotic covered with bandaid daily. Patient reports that she has been doing a lot of walking and standing and noticed that her toe was becoming a little more sore and red; deniesfever/chills/nausea/vomitting/any other related constitutional symptoms at this time.  Patient Active Problem List   Diagnosis Date Noted  . OA (osteoarthritis) of hip 11/05/2013  . Hip arthritis 10/15/2013  . S/P hip replacement 08/01/2011  . Visit for preventive health examination 08/01/2011  . Osteopenia 08/01/2011  . History of herpes labialis 08/01/2011  . ARTHRITIS, RIGHT HIP 05/11/2009  . TACHYCARDIA 01/20/2008  . GERD 10/08/2007    Current Outpatient Prescriptions on File Prior to Visit  Medication Sig Dispense Refill  . Cholecalciferol (VITAMIN D3) 5000 UNITS CAPS Take by mouth. 1 CAPSULE THREE TIMES WEEKLY    . fluticasone (FLONASE) 50 MCG/ACT nasal spray Place 1 spray into both nostrils daily.    . valACYclovir (VALTREX) 1000 MG tablet Take 2 tablets (2,000 mg total) by mouth 2 (two) times daily. AS NEEDED FOR FEVER BLISTERS 30 tablet 11  . vitamin C (ASCORBIC ACID) 500 MG tablet Take 500 mg three times weekly     No current facility-administered medications on file prior to visit.     No Known Allergies  Objective:  General: Well developed, nourished, in no acute distress, alert and oriented x3   Dermatology: Skin is warm, dry and supple bilateral. Left hallux medial nail bed appears to be clean, dry, with mild granular tissue and surrounding eschar/scab. (+) Erythema. (+) Edema. (-) serosanguous drainage present. The remaining nails appear unremarkable at this time. There are no other lesions or other signs of infection  present.  Neurovascular status:  Intact. No lower extremity swelling; No pain with calf compression bilateral.  Musculoskeletal: Mild tenderness to palpation of the Left hallux nail fold(s). Muscular strength within normal limits bilateral.   Assesement and Plan: Problem List Items Addressed This Visit    None    Visit Diagnoses    S/P nail surgery    -  Primary   Relevant Medications   amoxicillin-clavulanate (AUGMENTIN) 875-125 MG tablet   Toe pain, left       Relevant Medications   amoxicillin-clavulanate (AUGMENTIN) 875-125 MG tablet      -Examined patient  -Cleansed left hallux medial nail fold and gently scrubbed with peroxide and q-tip/curetted away eschar at site and applied antibiotic cream covered with bandaid.  -Discussed plan of care with patient. -Patient to continue soaking in a weak solution of Epsom salt and warm water, increase twice daily. Patient was instructed to soak for 15-20 minutes each day until the toe appears normal and there is no drainage, redness, tenderness, or swelling at the procedure site, and apply neosporin and a gauze or bandaid dressing each day as needed. May leave open to air at night. -Rx Augmentin for preventative measures  -Educated patient on long term care after nail surgery. -Patient was instructed to monitor the toe for reoccurrence and signs of infection; Patient advised to return to office or go to ER if toe becomes more red, hot or swollen. -Patient is to return next week as scheduled or sooner if problems arise.  Landis Martins, DPM

## 2016-12-12 NOTE — Telephone Encounter (Signed)
Pt states in the last week the toe that had an ingrown procedure has become more red, with heat. I left message for pt to call the appt line to get an appt as soon as possible, or to call me again if she had any questions.

## 2016-12-19 ENCOUNTER — Encounter: Payer: Self-pay | Admitting: Sports Medicine

## 2016-12-19 ENCOUNTER — Ambulatory Visit (INDEPENDENT_AMBULATORY_CARE_PROVIDER_SITE_OTHER): Payer: Self-pay | Admitting: Sports Medicine

## 2016-12-19 DIAGNOSIS — Z9889 Other specified postprocedural states: Secondary | ICD-10-CM

## 2016-12-19 DIAGNOSIS — M79675 Pain in left toe(s): Secondary | ICD-10-CM

## 2016-12-19 NOTE — Patient Instructions (Signed)

## 2016-12-19 NOTE — Progress Notes (Signed)
Subjective: Katherine Rodriguez is a 61 y.o. female patient returns to office today for follow up evaluation after having Left Hallux medial permanent nail avulsion performed on 12-05-16. Patient has been soaking using epsom salt and applying topical antibiotic covered with bandaid daily. Patient is on Augmentin; deniesfever/chills/nausea/vomitting/any other related constitutional symptoms at this time.  Patient Active Problem List   Diagnosis Date Noted  . OA (osteoarthritis) of hip 11/05/2013  . Hip arthritis 10/15/2013  . S/P hip replacement 08/01/2011  . Visit for preventive health examination 08/01/2011  . Osteopenia 08/01/2011  . History of herpes labialis 08/01/2011  . ARTHRITIS, RIGHT HIP 05/11/2009  . TACHYCARDIA 01/20/2008  . GERD 10/08/2007    Current Outpatient Prescriptions on File Prior to Visit  Medication Sig Dispense Refill  . amoxicillin-clavulanate (AUGMENTIN) 875-125 MG tablet Take 1 tablet by mouth 2 (two) times daily. 28 tablet 0  . Cholecalciferol (VITAMIN D3) 5000 UNITS CAPS Take by mouth. 1 CAPSULE THREE TIMES WEEKLY    . fluticasone (FLONASE) 50 MCG/ACT nasal spray Place 1 spray into both nostrils daily.    . valACYclovir (VALTREX) 1000 MG tablet Take 2 tablets (2,000 mg total) by mouth 2 (two) times daily. AS NEEDED FOR FEVER BLISTERS 30 tablet 11  . vitamin C (ASCORBIC ACID) 500 MG tablet Take 500 mg three times weekly     No current facility-administered medications on file prior to visit.     No Known Allergies  Objective:  General: Well developed, nourished, in no acute distress, alert and oriented x3   Dermatology: Skin is warm, dry and supple bilateral. Left hallux medial nail bed appears to be clean, dry, with mild granular tissue and surrounding eschar/scab. Decreased Erythema. Decreased Edema. (-) serosanguous drainage present. The remaining nails appear unremarkable at this time. There are no other lesions or other signs of infection  present.  Neurovascular status: Intact. No lower extremity swelling; No pain with calf compression bilateral.  Musculoskeletal: Mild tenderness to palpation of the Left hallux nail fold(s). Muscular strength within normal limits bilateral.   Assesement and Plan: Problem List Items Addressed This Visit    None    Visit Diagnoses    S/P nail surgery    -  Primary   Toe pain, left          -Examined patient  -Cleansed left hallux medial nail fold and gently scrubbed with peroxide and q-tip/curetted away eschar at site and applied antibiotic cream covered with bandaid.  -Discussed plan of care with patient. -Patient to continue soaking in a weak solution of Epsom salt and warm water, increase twice daily. Patient was instructed to soak for 15-20 minutes each day until the toe appears normal and there is no drainage, redness, tenderness, or swelling at the procedure site, and apply neosporin and a gauze or bandaid dressing each day as needed. May leave open to air at night. -Continue with Augmentin until completed  -Educated patient on long term care after nail surgery. -Patient was instructed to monitor the toe for reoccurrence and signs of infection; Patient advised to return to office or go to ER if toe becomes more red, hot or swollen. -Patient is to return as needed or sooner if problems arise.  Landis Martins, DPM

## 2017-05-11 ENCOUNTER — Encounter: Payer: Self-pay | Admitting: Internal Medicine

## 2017-05-18 ENCOUNTER — Other Ambulatory Visit: Payer: Self-pay | Admitting: Internal Medicine

## 2017-05-18 DIAGNOSIS — Z1239 Encounter for other screening for malignant neoplasm of breast: Secondary | ICD-10-CM

## 2017-06-06 ENCOUNTER — Ambulatory Visit
Admission: RE | Admit: 2017-06-06 | Discharge: 2017-06-06 | Disposition: A | Discharge status: Home / Self Care | Payer: 59 | Source: Ambulatory Visit | Attending: Internal Medicine | Admitting: Internal Medicine

## 2017-06-06 DIAGNOSIS — Z1231 Encounter for screening mammogram for malignant neoplasm of breast: Secondary | ICD-10-CM | POA: Diagnosis not present

## 2017-06-06 DIAGNOSIS — Z1239 Encounter for other screening for malignant neoplasm of breast: Secondary | ICD-10-CM

## 2017-06-06 DIAGNOSIS — E2839 Other primary ovarian failure: Secondary | ICD-10-CM

## 2017-06-06 DIAGNOSIS — M810 Age-related osteoporosis without current pathological fracture: Secondary | ICD-10-CM | POA: Diagnosis not present

## 2017-06-06 DIAGNOSIS — M8580 Other specified disorders of bone density and structure, unspecified site: Secondary | ICD-10-CM

## 2017-06-06 DIAGNOSIS — Z780 Asymptomatic menopausal state: Secondary | ICD-10-CM | POA: Diagnosis not present

## 2017-06-08 ENCOUNTER — Other Ambulatory Visit: Payer: Self-pay | Admitting: Gastroenterology

## 2017-06-08 DIAGNOSIS — Z83719 Family history of colon polyps, unspecified: Secondary | ICD-10-CM

## 2017-06-08 DIAGNOSIS — Z8371 Family history of colonic polyps: Secondary | ICD-10-CM

## 2017-06-15 ENCOUNTER — Other Ambulatory Visit: Payer: Self-pay | Admitting: Internal Medicine

## 2017-06-15 DIAGNOSIS — E2839 Other primary ovarian failure: Secondary | ICD-10-CM

## 2017-06-15 DIAGNOSIS — M810 Age-related osteoporosis without current pathological fracture: Secondary | ICD-10-CM

## 2017-06-28 DIAGNOSIS — M810 Age-related osteoporosis without current pathological fracture: Secondary | ICD-10-CM | POA: Insufficient documentation

## 2017-06-28 NOTE — Progress Notes (Signed)
Chief Complaint  Patient presents with  . Results    Bone Density    HPI: Katherine Rodriguez 61 y.o.  comesin to discuss  Abnormal bone desnity with low density   She has never had a fragility fracture just a broken toe once.  Is taking vitamin D 5000 units 3 once a week equivalent to 15,000 units a week. She was taking high-dose 3-4 years ago when she had a vitamin D level of 28 into the 30s. Mom had a hip fracture when she was in her 57s passed away from Alzheimer's later.  She does not have a restrictive diet significant valve disease thyroid dysfunction.  She does regular walking activity but no upper body weights. This bone density was done at the breast center therefore not comparable to her previous one. Site Region Measured Date Measured Age YA BMD Significant CHANGE T-score Left Forearm Radius 33% 06/06/2017 62.8 -2.8 0.642 g/cm2  AP Spine L1-L4 06/06/2017 62.8 -2.1 0.919 g/cm2 No history of premature menopause celiac disease or stones. ROS: See pertinent positives and negatives per HPI.  Past Medical History:  Diagnosis Date  . Abnormal Pap smear of anus   . Anemia 2010   hx of  . Arthritis   . Endometriosis   . GERD (gastroesophageal reflux disease) 2012   hx of, none recent  . History of blood transfusion 2010   no reaction  . History of herpes labialis 08/01/2011  . Periumbilical abdominal pain 03/11/2013   doubt uti but cx pending consdier small umbi hernia . refer  options of eval discussed    . Pneumonia 1994   hx of  . PPD positive   . Recurrent HSV (herpes simplex virus)   . S/P hip replacement 08/01/2011    Family History  Problem Relation Age of Onset  . Hypertension Mother   . Arthritis Mother        spinal stenosis  . Lymphoma Unknown   . Other Unknown        bowel disease    Social History   Socioeconomic History  . Marital status: Married    Spouse name: None  . Number of children: None  . Years of education: None  . Highest education  level: None  Social Needs  . Financial resource strain: None  . Food insecurity - worry: None  . Food insecurity - inability: None  . Transportation needs - medical: None  . Transportation needs - non-medical: None  Occupational History  . None  Tobacco Use  . Smoking status: Former Smoker    Years: 15.00    Types: Cigarettes    Last attempt to quit: 08/21/1986    Years since quitting: 30.8  . Smokeless tobacco: Never Used  Substance and Sexual Activity  . Alcohol use: Yes    Comment: rare  . Drug use: No  . Sexual activity: None  Other Topics Concern  . None  Social History Narrative   Married   Regular exercise- yes   Cutting back on caffeine   Sleep 7    HH 3 pet dog and cat.    Works hospital nursing   G4 P1     Outpatient Medications Prior to Visit  Medication Sig Dispense Refill  . Cholecalciferol (VITAMIN D3) 5000 UNITS CAPS Take by mouth. 1 CAPSULE THREE TIMES WEEKLY    . loratadine (CLARITIN) 10 MG tablet Take 10 mg daily by mouth.    . valACYclovir (VALTREX) 1000 MG tablet Take 2  tablets (2,000 mg total) by mouth 2 (two) times daily. AS NEEDED FOR FEVER BLISTERS 30 tablet 11  . vitamin C (ASCORBIC ACID) 500 MG tablet Take 500 mg three times weekly    . fluticasone (FLONASE) 50 MCG/ACT nasal spray Place 1 spray into both nostrils daily.    Marland Kitchen amoxicillin-clavulanate (AUGMENTIN) 875-125 MG tablet Take 1 tablet by mouth 2 (two) times daily. (Patient not taking: Reported on 06/29/2017) 28 tablet 0   No facility-administered medications prior to visit.      EXAM:  BP 108/78 (BP Location: Left Arm, Patient Position: Sitting, Cuff Size: Normal)   Pulse (!) 58   Temp 98.5 F (36.9 C) (Oral)   Wt 147 lb 12.8 oz (67 kg)   BMI 25.37 kg/m   Body mass index is 25.37 kg/m.  GENERAL: vitals reviewed and listed above, alert, oriented, appears well hydrated and in no acute distress HEENT: atraumatic, conjunctiva  clear, no obvious abnormalities on inspection of  external nose and earsPSYCH: pleasant and cooperative, no obvious depression or anxiety DEXA scan results reviewed with patient. ASSESSMENT AND PLAN:  Discussed the following assessment and plan:  Osteoporosis without current pathological fracture, unspecified osteoporosis type  Estrogen deficiency - Plan: Vitamin D 1,25 dihydroxy  Osteoporosis, unspecified osteoporosis type, unspecified pathological fracture presence - Plan: Vitamin D 1,25 dihydroxy Discussed results at this point check vitamin D level she is on 15,000 units a week add resistance training lifestyle repeat the bone density in 2 years.   -Patient advised to return or notify health care team  if symptoms worsen ,persist or new concerns arise.  Patient Instructions  Optimize resistance training   Upper body .    Check  Vit d today .   Advise   Repeat DEXA  In 2 years .           [name]. [name] M.D.

## 2017-06-29 ENCOUNTER — Encounter: Payer: Self-pay | Admitting: Internal Medicine

## 2017-06-29 ENCOUNTER — Ambulatory Visit (INDEPENDENT_AMBULATORY_CARE_PROVIDER_SITE_OTHER): Payer: 59 | Admitting: Internal Medicine

## 2017-06-29 DIAGNOSIS — M810 Age-related osteoporosis without current pathological fracture: Secondary | ICD-10-CM

## 2017-06-29 DIAGNOSIS — E2839 Other primary ovarian failure: Secondary | ICD-10-CM

## 2017-06-29 NOTE — Patient Instructions (Signed)
Optimize resistance training   Upper body .    Check  Vit d today .   Advise   Repeat DEXA  In 2 years .

## 2017-07-02 ENCOUNTER — Ambulatory Visit
Admission: RE | Admit: 2017-07-02 | Discharge: 2017-07-02 | Disposition: A | Discharge status: Home / Self Care | Payer: 59 | Source: Ambulatory Visit | Attending: Gastroenterology | Admitting: Gastroenterology

## 2017-07-02 DIAGNOSIS — Z8371 Family history of colonic polyps: Secondary | ICD-10-CM | POA: Diagnosis not present

## 2017-07-04 LAB — VITAMIN D 1,25 DIHYDROXY
VITAMIN D3 1, 25 (OH): 45 pg/mL
Vitamin D 1, 25 (OH)2 Total: 45 pg/mL (ref 18–72)

## 2017-07-23 MED FILL — valACYclovir HCL 1 GM TABS: 1 | 7 days supply | Qty: 30 | Fill #2

## 2017-08-06 DIAGNOSIS — Z23 Encounter for immunization: Secondary | ICD-10-CM | POA: Diagnosis not present

## 2017-08-06 DIAGNOSIS — L409 Psoriasis, unspecified: Secondary | ICD-10-CM | POA: Diagnosis not present

## 2017-08-06 MED FILL — CLOBETASOL 0.05% SOLUTION: 0.05 | 14 days supply | Qty: 50 | Fill #0

## 2017-10-26 DIAGNOSIS — Z6824 Body mass index (BMI) 24.0-24.9, adult: Secondary | ICD-10-CM | POA: Diagnosis not present

## 2017-10-26 DIAGNOSIS — Z1151 Encounter for screening for human papillomavirus (HPV): Secondary | ICD-10-CM | POA: Diagnosis not present

## 2017-10-26 DIAGNOSIS — Z01419 Encounter for gynecological examination (general) (routine) without abnormal findings: Secondary | ICD-10-CM | POA: Diagnosis not present

## 2017-11-14 ENCOUNTER — Other Ambulatory Visit: Payer: Self-pay | Admitting: Internal Medicine

## 2017-11-15 DIAGNOSIS — H5213 Myopia, bilateral: Secondary | ICD-10-CM | POA: Diagnosis not present

## 2017-11-15 MED FILL — valACYclovir HCL 1 GM TABS: 1 | 7 days supply | Qty: 30 | Fill #0

## 2017-12-21 MED FILL — DUREZOL 0.05% EYE DROPS: 0.05 | 25 days supply | Qty: 5 | Fill #0

## 2017-12-21 MED FILL — GATIFLOXACIN 0.5 % SOLN: 0.5 | 14 days supply | Qty: 3 | Fill #0

## 2018-01-01 DIAGNOSIS — H25811 Combined forms of age-related cataract, right eye: Secondary | ICD-10-CM | POA: Diagnosis not present

## 2018-01-01 DIAGNOSIS — H2511 Age-related nuclear cataract, right eye: Secondary | ICD-10-CM | POA: Diagnosis not present

## 2018-01-02 MED FILL — valACYclovir HCL 1 GM TABS: 1 | 7 days supply | Qty: 30 | Fill #1

## 2018-01-30 MED FILL — GATIFLOXACIN 0.5% EYE DROPS: 0.5 | 13 days supply | Qty: 3 | Fill #0

## 2018-01-30 MED FILL — DUREZOL 0.05% EYE DROPS: 0.05 | 25 days supply | Qty: 5 | Fill #0

## 2018-02-12 DIAGNOSIS — H25812 Combined forms of age-related cataract, left eye: Secondary | ICD-10-CM | POA: Diagnosis not present

## 2018-02-12 DIAGNOSIS — H2512 Age-related nuclear cataract, left eye: Secondary | ICD-10-CM | POA: Diagnosis not present

## 2018-03-18 MED FILL — valACYclovir HCL 1 GM TABS: 1 | 7 days supply | Qty: 30 | Fill #2

## 2018-06-10 ENCOUNTER — Other Ambulatory Visit: Payer: Self-pay | Admitting: Internal Medicine

## 2018-06-10 DIAGNOSIS — Z1231 Encounter for screening mammogram for malignant neoplasm of breast: Secondary | ICD-10-CM

## 2018-07-03 DIAGNOSIS — D485 Neoplasm of uncertain behavior of skin: Secondary | ICD-10-CM | POA: Diagnosis not present

## 2018-07-03 DIAGNOSIS — Z23 Encounter for immunization: Secondary | ICD-10-CM | POA: Diagnosis not present

## 2018-07-03 DIAGNOSIS — C44712 Basal cell carcinoma of skin of right lower limb, including hip: Secondary | ICD-10-CM | POA: Diagnosis not present

## 2018-07-22 ENCOUNTER — Ambulatory Visit
Admission: RE | Admit: 2018-07-22 | Discharge: 2018-07-22 | Disposition: A | Discharge status: Home / Self Care | Payer: 59 | Source: Ambulatory Visit | Attending: Internal Medicine | Admitting: Internal Medicine

## 2018-07-22 DIAGNOSIS — Z1231 Encounter for screening mammogram for malignant neoplasm of breast: Secondary | ICD-10-CM

## 2018-07-23 DIAGNOSIS — C44712 Basal cell carcinoma of skin of right lower limb, including hip: Secondary | ICD-10-CM | POA: Diagnosis not present

## 2018-09-02 NOTE — Patient Instructions (Addendum)
Go back on daily Flonase .  For congestion and see if  Prevents the dizziness.   consider getting  dexa  Update this year .  Preventive Care 40-64 Years, Female Preventive care refers to lifestyle choices and visits with your health care provider that can promote health and wellness. What does preventive care include?   Can get   A yearly physical exam. This is also called an annual well check.  Dental exams once or twice a year.  Routine eye exams. Ask your health care provider how often you should have your eyes checked.  Personal lifestyle choices, including: ? Daily care of your teeth and gums. ? Regular physical activity. ? Eating a healthy diet. ? Avoiding tobacco and drug use. ? Limiting alcohol use. ? Practicing safe sex. ? Taking low-dose aspirin daily starting at age 52. ? Taking vitamin and mineral supplements as recommended by your health care provider. What happens during an annual well check? The services and screenings done by your health care provider during your annual well check will depend on your age, overall health, lifestyle risk factors, and family history of disease. Counseling Your health care provider may ask you questions about your:  Alcohol use.  Tobacco use.  Drug use.  Emotional well-being.  Home and relationship well-being.  Sexual activity.  Eating habits.  Work and work Statistician.  Method of birth control.  Menstrual cycle.  Pregnancy history. Screening You may have the following tests or measurements:  Height, weight, and BMI.  Blood pressure.  Lipid and cholesterol levels. These may be checked every 5 years, or more frequently if you are over 85 years old.  Skin check.  Lung cancer screening. You may have this screening every year starting at age 105 if you have a 30-pack-year history of smoking and currently smoke or have quit within the past 15 years.  Colorectal cancer screening. All adults should have this  screening starting at age 4 and continuing until age 49. Your health care provider may recommend screening at age 77. You will have tests every 1-10 years, depending on your results and the type of screening test. People at increased risk should start screening at an earlier age. Screening tests may include: ? Guaiac-based fecal occult blood testing. ? Fecal immunochemical test (FIT). ? Stool DNA test. ? Virtual colonoscopy. ? Sigmoidoscopy. During this test, a flexible tube with a tiny camera (sigmoidoscope) is used to examine your rectum and lower colon. The sigmoidoscope is inserted through your anus into your rectum and lower colon. ? Colonoscopy. During this test, a long, thin, flexible tube with a tiny camera (colonoscope) is used to examine your entire colon and rectum.  Hepatitis C blood test.  Hepatitis B blood test.  Sexually transmitted disease (STD) testing.  Diabetes screening. This is done by checking your blood sugar (glucose) after you have not eaten for a while (fasting). You may have this done every 1-3 years.  Mammogram. This may be done every 1-2 years. Talk to your health care provider about when you should start having regular mammograms. This may depend on whether you have a family history of breast cancer.  BRCA-related cancer screening. This may be done if you have a family history of breast, ovarian, tubal, or peritoneal cancers.  Pelvic exam and Pap test. This may be done every 3 years starting at age 106. Starting at age 22, this may be done every 5 years if you have a Pap test in combination with an  HPV test.  Bone density scan. This is done to screen for osteoporosis. You may have this scan if you are at high risk for osteoporosis. Discuss your test results, treatment options, and if necessary, the need for more tests with your health care provider. Vaccines Your health care provider may recommend certain vaccines, such as:  Influenza vaccine. This is  recommended every year.  Tetanus, diphtheria, and acellular pertussis (Tdap, Td) vaccine. You may need a Td booster every 10 years.  Varicella vaccine. You may need this if you have not been vaccinated.  Zoster vaccine. You may need this after age 42.  Measles, mumps, and rubella (MMR) vaccine. You may need at least one dose of MMR if you were born in 1957 or later. You may also need a second dose.  Pneumococcal 13-valent conjugate (PCV13) vaccine. You may need this if you have certain conditions and were not previously vaccinated.  Pneumococcal polysaccharide (PPSV23) vaccine. You may need one or two doses if you smoke cigarettes or if you have certain conditions.  Meningococcal vaccine. You may need this if you have certain conditions.  Hepatitis A vaccine. You may need this if you have certain conditions or if you travel or work in places where you may be exposed to hepatitis A.  Hepatitis B vaccine. You may need this if you have certain conditions or if you travel or work in places where you may be exposed to hepatitis B.  Haemophilus influenzae type b (Hib) vaccine. You may need this if you have certain conditions. Talk to your health care provider about which screenings and vaccines you need and how often you need them. This information is not intended to replace advice given to you by your health care provider. Make sure you discuss any questions you have with your health care provider. Document Released: 09/03/2015 Document Revised: 09/27/2017 Document Reviewed: 06/08/2015 Elsevier Interactive Patient Education  2019 [employer].

## 2018-09-02 NOTE — Progress Notes (Signed)
Chief Complaint  Patient presents with  . Annual Exam    Pt is wondering if she should be concerned about an auto immune disease due to sister passing away from one     HPI: Patient  Katherine Rodriguez  63 y.o. comes in today for Preventive Health Care visit  Sister passed in November   Dx  Cold  agglutinin disease. Neg died from anemia  Hg of 2  And resp failure.    Felt to be primary not secondary    Had bcca removed from rle.    Health Maintenance  Topic Date Due  . Hepatitis C Screening  14-Mar-1956  . HIV Screening  07/22/1969  . PAP SMEAR-Modifier  07/21/2018  . TETANUS/TDAP  05/12/2019  . MAMMOGRAM  07/22/2020  . COLONOSCOPY  10/15/2021  . INFLUENZA VACCINE  Completed   Health Maintenance Review LIFESTYLE:  Exercise:   Walks piliates.  No physical limitations  Tobacco/ETS: no  Alcohol:  less than onc a week.  Sugar beverages:  Not much  Sleep: ave 7.5  Drug use: no HH of   2 1 cat  Work:  32   Cataracts  Last year.  ROS:  ocass dizzy  ocass congestion  But no ha one time am nausea  And gas GEN/ HEENT: No fever, significant weight changes sweats headaches vision problems hearing changes, CV/ PULM; No chest pain shortness of breath cough, syncope,edema  change in exercise tolerance. GI /GU: No adominal pain, vomiting, change in bowel habits. No blood in the stool. No significant GU symptoms. SKIN/HEME: ,no acute skin rashes suspicious lesions or bleeding. No lymphadenopathy, nodules, masses.  NEURO/ PSYCH:  No neurologic signs such as weakness numbness. No depression anxiety. IMM/ Allergy: No unusual infections.  Allergy .   REST of 12 system review negative except as per HPI   Past Medical History:  Diagnosis Date  . Abnormal Pap smear of anus   . Anemia 2010   hx of  . Arthritis   . Basal cell carcinoma    leg 2020  . Endometriosis   . GERD (gastroesophageal reflux disease) 2012   hx of, none recent  . History of blood transfusion 2010   no reaction  .  History of herpes labialis 08/01/2011  . Periumbilical abdominal pain 03/11/2013   doubt uti but cx pending consdier small umbi hernia . refer  options of eval discussed    . Pneumonia 1994   hx of  . PPD positive   . Recurrent HSV (herpes simplex virus)   . S/P hip replacement 08/01/2011    Past Surgical History:  Procedure Laterality Date  . JOINT REPLACEMENT Right 2010   hip  . LAPAROTOMY  1980's, 1991   exploratory, laser for endometriosis  . THYROIDECTOMY, PARTIAL  1973  . TOTAL HIP ARTHROPLASTY Left 11/05/2013   Procedure: LEFT TOTAL HIP ARTHROPLASTY ANTERIOR APPROACH;  Surgeon: Gearlean Alf, MD;  Location: [facility];  Service: Orthopedics;  Laterality: Left;    Family History  Problem Relation Age of Onset  . Hypertension Mother   . Arthritis Mother        spinal stenosis  . Lymphoma Other   . Other Other        bowel disease  . Autoimmune disease Sister        cold agglutinin hemolytic anemia presumed autoimmune   . Breast cancer Neg Hx     Social History   Socioeconomic History  . Marital status: Married  Spouse name: Not on file  . Number of children: Not on file  . Years of education: Not on file  . Highest education level: Not on file  Occupational History  . Not on file  Social Needs  . Financial resource strain: Not on file  . Food insecurity:    Worry: Not on file    Inability: Not on file  . Transportation needs:    Medical: Not on file    Non-medical: Not on file  Tobacco Use  . Smoking status: Former Smoker    Years: 15.00    Types: Cigarettes    Last attempt to quit: 08/21/1986    Years since quitting: 32.0  . Smokeless tobacco: Never Used  Substance and Sexual Activity  . Alcohol use: Yes    Comment: rare  . Drug use: No  . Sexual activity: Not on file  Lifestyle  . Physical activity:    Days per week: Not on file    Minutes per session: Not on file  . Stress: Not on file  Relationships  . Social connections:    Talks on phone:  Not on file    Gets together: Not on file    Attends religious service: Not on file    Active member of club or organization: Not on file    Attends meetings of clubs or organizations: Not on file    Relationship status: Not on file  Other Topics Concern  . Not on file  Social History Narrative   Married   Regular exercise- yes   Cutting back on caffeine   Sleep 7    HH 3 pet dog and cat.    Works hospital nursing   G4 P1     Outpatient Medications Prior to Visit  Medication Sig Dispense Refill  . Cholecalciferol (VITAMIN D3) 5000 UNITS CAPS Take by mouth. 1 CAPSULE THREE TIMES WEEKLY    . loratadine (CLARITIN) 10 MG tablet Take 10 mg daily by mouth.    . valACYclovir (VALTREX) 1000 MG tablet TAKE 2 TABLETS BY MOUTH TWICE A DAY AS NEEDED FOR FEVER BLISTERS 30 tablet 5  . vitamin C (ASCORBIC ACID) 500 MG tablet Take 500 mg three times weekly    . fluticasone (FLONASE) 50 MCG/ACT nasal spray Place 1 spray into both nostrils daily.     No facility-administered medications prior to visit.      EXAM:  BP 114/60 (BP Location: Right Arm, Patient Position: Sitting, Cuff Size: Normal)   Pulse 60   Temp 98.3 F (36.8 C) (Oral)   Ht '5\' 4"'  (1.626 m)   Wt 150 lb 9.6 oz (68.3 kg)   BMI 25.85 kg/m   Body mass index is 25.85 kg/m. Wt Readings from Last 3 Encounters:  09/03/18 150 lb 9.6 oz (68.3 kg)  06/29/17 147 lb 12.8 oz (67 kg)  08/29/16 146 lb (66.2 kg)    Physical Exam: Vital signs reviewed KCL:EXNT is a well-developed well-nourished alert cooperative    who appearsr stated age in no acute distress.  Mild nasal stuffiness  HEENT: normocephalic atraumatic , Eyes: PERRL EOM's full, conjunctiva clear, Nares: paten,t no deformity discharge or tenderness., Ears: no deformity EAC's clear TMs with normal landmarks. Mouth: clear OP, no lesions, edema.  Moist mucous membranes. Dentition in adequate repair. NECK: supple without masses, thyromegaly or bruits. CHEST/PULM:  Clear to  auscultation and percussion breath sounds equal no wheeze , rales or rhonchi. No chest wall deformities or tenderness. Breast: normal by  inspection . No dimpling, discharge, masses, tenderness or discharge . CV: PMI is nondisplaced, S1 S2 no gallops, murmurs, rubs. Peripheral pulses are full without delay.No JVD .  ABDOMEN: Bowel sounds normal nontender  No guard or rebound, no hepato splenomegal no CVA tenderness.  No hernia. Extremtities:  No clubbing cyanosis or edema, no acute joint swelling or redness no focal atrophy NEURO:  Oriented x3, cranial nerves 3-12 appear to be intact, no obvious focal weakness,gait within normal limits no abnormal reflexes or asymmetrical neg romberg  SKIN: No acute rashes normal turgor, color, no bruising or petechiae. bandage rle PSYCH: Oriented, good eye contact, no obvious depression anxiety, cognition and judgment appear normal. LN: no cervical axillary inguinal adenopathy  Lab Results  Component Value Date   WBC 5.8 08/23/2016   HGB 13.8 08/23/2016   HCT 40.6 08/23/2016   PLT 239.0 08/23/2016   GLUCOSE 94 08/23/2016   CHOL 198 08/23/2016   TRIG 98.0 08/23/2016   HDL 68.50 08/23/2016   LDLCALC 110 (H) 08/23/2016   ALT 32 08/23/2016   AST 26 08/23/2016   NA 140 08/23/2016   K 4.4 08/23/2016   CL 103 08/23/2016   CREATININE 0.76 08/23/2016   BUN 16 08/23/2016   CO2 32 08/23/2016   TSH 3.71 08/23/2016   INR 0.95 10/28/2013    BP Readings from Last 3 Encounters:  09/03/18 114/60  06/29/17 108/78  12/12/16 127/71   ASSESSMENT AND PLAN:  Discussed the following assessment and plan:  Visit for preventive health examination - Plan: Basic metabolic panel, CBC with Differential/Platelet, Hepatic function panel, Lipid panel, TSH  Medication management  Need for hepatitis C screening test - Plan: Hepatitis C antibody  Screening, lipid - Plan: Basic metabolic panel, CBC with Differential/Platelet, Hepatic function panel, Lipid panel,  TSH  Osteopenia, unspecified location - Plan: Basic metabolic panel, CBC with Differential/Platelet, Hepatic function panel, Lipid panel, TSH  Need for shingles vaccine - Plan: Varicella-zoster vaccine IM (Shingrix) dexa next  year .   Nasal stuffiness  Add flonase regular   Fu with alarm or progressive sx   Patient Care Team: Aliene Tamura, [name], MD as PCP - Dossie Arbour, MD (Gastroenterology) Avon Gully, NP as Nurse Practitioner (Obstetrics and Gynecology) Gaynelle Arabian, MD as Consulting Physician (Orthopedic Surgery) Sydnee Levans, MD as Referring Physician (Dermatology) Patient Instructions  Go back on daily Flonase .  For congestion and see if  Prevents the dizziness.   consider getting  dexa  Update this year .  Preventive Care 40-64 Years, Female Preventive care refers to lifestyle choices and visits with your health care provider that can promote health and wellness. What does preventive care include?   Can get   A yearly physical exam. This is also called an annual well check.  Dental exams once or twice a year.  Routine eye exams. Ask your health care provider how often you should have your eyes checked.  Personal lifestyle choices, including: ? Daily care of your teeth and gums. ? Regular physical activity. ? Eating a healthy diet. ? Avoiding tobacco and drug use. ? Limiting alcohol use. ? Practicing safe sex. ? Taking low-dose aspirin daily starting at age 53. ? Taking vitamin and mineral supplements as recommended by your health care provider. What happens during an annual well check? The services and screenings done by your health care provider during your annual well check will depend on your age, overall health, lifestyle risk factors, and family history of disease. Counseling Your  health care provider may ask you questions about your:  Alcohol use.  Tobacco use.  Drug use.  Emotional well-being.  Home and relationship  well-being.  Sexual activity.  Eating habits.  Work and work Statistician.  Method of birth control.  Menstrual cycle.  Pregnancy history. Screening You may have the following tests or measurements:  Height, weight, and BMI.  Blood pressure.  Lipid and cholesterol levels. These may be checked every 5 years, or more frequently if you are over 100 years old.  Skin check.  Lung cancer screening. You may have this screening every year starting at age 32 if you have a 30-pack-year history of smoking and currently smoke or have quit within the past 15 years.  Colorectal cancer screening. All adults should have this screening starting at age 54 and continuing until age 42. Your health care provider may recommend screening at age 69. You will have tests every 1-10 years, depending on your results and the type of screening test. People at increased risk should start screening at an earlier age. Screening tests may include: ? Guaiac-based fecal occult blood testing. ? Fecal immunochemical test (FIT). ? Stool DNA test. ? Virtual colonoscopy. ? Sigmoidoscopy. During this test, a flexible tube with a tiny camera (sigmoidoscope) is used to examine your rectum and lower colon. The sigmoidoscope is inserted through your anus into your rectum and lower colon. ? Colonoscopy. During this test, a long, thin, flexible tube with a tiny camera (colonoscope) is used to examine your entire colon and rectum.  Hepatitis C blood test.  Hepatitis B blood test.  Sexually transmitted disease (STD) testing.  Diabetes screening. This is done by checking your blood sugar (glucose) after you have not eaten for a while (fasting). You may have this done every 1-3 years.  Mammogram. This may be done every 1-2 years. Talk to your health care provider about when you should start having regular mammograms. This may depend on whether you have a family history of breast cancer.  BRCA-related cancer screening. This  may be done if you have a family history of breast, ovarian, tubal, or peritoneal cancers.  Pelvic exam and Pap test. This may be done every 3 years starting at age 44. Starting at age 40, this may be done every 5 years if you have a Pap test in combination with an HPV test.  Bone density scan. This is done to screen for osteoporosis. You may have this scan if you are at high risk for osteoporosis. Discuss your test results, treatment options, and if necessary, the need for more tests with your health care provider. Vaccines Your health care provider may recommend certain vaccines, such as:  Influenza vaccine. This is recommended every year.  Tetanus, diphtheria, and acellular pertussis (Tdap, Td) vaccine. You may need a Td booster every 10 years.  Varicella vaccine. You may need this if you have not been vaccinated.  Zoster vaccine. You may need this after age 79.  Measles, mumps, and rubella (MMR) vaccine. You may need at least one dose of MMR if you were born in 1957 or later. You may also need a second dose.  Pneumococcal 13-valent conjugate (PCV13) vaccine. You may need this if you have certain conditions and were not previously vaccinated.  Pneumococcal polysaccharide (PPSV23) vaccine. You may need one or two doses if you smoke cigarettes or if you have certain conditions.  Meningococcal vaccine. You may need this if you have certain conditions.  Hepatitis A vaccine. You may  need this if you have certain conditions or if you travel or work in places where you may be exposed to hepatitis A.  Hepatitis B vaccine. You may need this if you have certain conditions or if you travel or work in places where you may be exposed to hepatitis B.  Haemophilus influenzae type b (Hib) vaccine. You may need this if you have certain conditions. Talk to your health care provider about which screenings and vaccines you need and how often you need them. This information is not intended to replace  advice given to you by your health care provider. Make sure you discuss any questions you have with your health care provider. Document Released: 09/03/2015 Document Revised: 09/27/2017 Document Reviewed: 06/08/2015 Elsevier Interactive Patient Education  2019 [place]. Mariusz Jubb M.D.

## 2018-09-03 ENCOUNTER — Ambulatory Visit (INDEPENDENT_AMBULATORY_CARE_PROVIDER_SITE_OTHER): Payer: 59 | Admitting: Internal Medicine

## 2018-09-03 ENCOUNTER — Encounter: Payer: Self-pay | Admitting: Internal Medicine

## 2018-09-03 VITALS — BP 114/60 | HR 60 | Temp 98.3°F | Ht 64.0 in | Wt 150.6 lb

## 2018-09-03 DIAGNOSIS — Z0000 Encounter for general adult medical examination without abnormal findings: Secondary | ICD-10-CM

## 2018-09-03 DIAGNOSIS — Z1159 Encounter for screening for other viral diseases: Secondary | ICD-10-CM

## 2018-09-03 DIAGNOSIS — Z13220 Encounter for screening for lipoid disorders: Secondary | ICD-10-CM

## 2018-09-03 DIAGNOSIS — Z23 Encounter for immunization: Secondary | ICD-10-CM

## 2018-09-03 DIAGNOSIS — M8580 Other specified disorders of bone density and structure, unspecified site: Secondary | ICD-10-CM

## 2018-09-03 DIAGNOSIS — Z79899 Other long term (current) drug therapy: Secondary | ICD-10-CM | POA: Diagnosis not present

## 2018-09-03 LAB — CBC WITH DIFFERENTIAL/PLATELET
BASOS PCT: 0.5 % (ref 0.0–3.0)
Basophils Absolute: 0 10*3/uL (ref 0.0–0.1)
EOS ABS: 0.1 10*3/uL (ref 0.0–0.7)
Eosinophils Relative: 1 % (ref 0.0–5.0)
HCT: 41.7 % (ref 36.0–46.0)
HEMOGLOBIN: 14 g/dL (ref 12.0–15.0)
LYMPHS PCT: 17.2 % (ref 12.0–46.0)
Lymphs Abs: 1.2 10*3/uL (ref 0.7–4.0)
MCHC: 33.5 g/dL (ref 30.0–36.0)
MCV: 91.8 fl (ref 78.0–100.0)
Monocytes Absolute: 0.7 10*3/uL (ref 0.1–1.0)
Monocytes Relative: 10.3 % (ref 3.0–12.0)
Neutro Abs: 5.1 10*3/uL (ref 1.4–7.7)
Neutrophils Relative %: 71 % (ref 43.0–77.0)
PLATELETS: 225 10*3/uL (ref 150.0–400.0)
RBC: 4.54 Mil/uL (ref 3.87–5.11)
RDW: 12.7 % (ref 11.5–15.5)
WBC: 7.2 10*3/uL (ref 4.0–10.5)

## 2018-09-03 LAB — LIPID PANEL
CHOLESTEROL: 182 mg/dL (ref 0–200)
HDL: 62.4 mg/dL (ref >39.00)
LDL CALC: 96 mg/dL (ref 0–99)
NonHDL: 119.74
TRIGLYCERIDES: 121 mg/dL (ref 0.0–149.0)
Total CHOL/HDL Ratio: 3
VLDL: 24.2 mg/dL (ref 0.0–40.0)

## 2018-09-03 LAB — BASIC METABOLIC PANEL
BUN: 14 mg/dL (ref 6–23)
CALCIUM: 9.5 mg/dL (ref 8.4–10.5)
CHLORIDE: 104 meq/L (ref 96–112)
CO2: 31 mEq/L (ref 19–32)
CREATININE: 0.73 mg/dL (ref 0.40–1.20)
GFR: 85.28 mL/min (ref >60.00)
Glucose, Bld: 85 mg/dL (ref 70–99)
Potassium: 4.6 mEq/L (ref 3.5–5.1)
Sodium: 142 mEq/L (ref 135–145)

## 2018-09-03 LAB — HEPATIC FUNCTION PANEL
ALT: 20 U/L (ref 0–35)
AST: 21 U/L (ref 0–37)
Albumin: 4.6 g/dL (ref 3.5–5.2)
Alkaline Phosphatase: 63 U/L (ref 39–117)
BILIRUBIN TOTAL: 0.5 mg/dL (ref 0.2–1.2)
Bilirubin, Direct: 0.1 mg/dL (ref 0.0–0.3)
Total Protein: 6.9 g/dL (ref 6.0–8.3)

## 2018-09-03 LAB — TSH: TSH: 1.34 u[IU]/mL (ref 0.35–4.50)

## 2018-09-04 LAB — HEPATITIS C ANTIBODY
HEP C AB: NONREACTIVE
SIGNAL TO CUT-OFF: 0.03 (ref <1.00)

## 2018-10-20 MED FILL — valACYclovir HCL 1 GM TABS: 1 | 7 days supply | Qty: 30 | Fill #3

## 2018-10-29 DIAGNOSIS — Z1151 Encounter for screening for human papillomavirus (HPV): Secondary | ICD-10-CM | POA: Diagnosis not present

## 2018-10-29 DIAGNOSIS — Z6825 Body mass index (BMI) 25.0-25.9, adult: Secondary | ICD-10-CM | POA: Diagnosis not present

## 2018-10-29 DIAGNOSIS — Z01419 Encounter for gynecological examination (general) (routine) without abnormal findings: Secondary | ICD-10-CM | POA: Diagnosis not present

## 2018-11-04 DIAGNOSIS — N950 Postmenopausal bleeding: Secondary | ICD-10-CM | POA: Diagnosis not present

## 2019-03-25 DIAGNOSIS — Z85828 Personal history of other malignant neoplasm of skin: Secondary | ICD-10-CM | POA: Diagnosis not present

## 2019-03-25 DIAGNOSIS — L814 Other melanin hyperpigmentation: Secondary | ICD-10-CM | POA: Diagnosis not present

## 2019-03-25 DIAGNOSIS — D225 Melanocytic nevi of trunk: Secondary | ICD-10-CM | POA: Diagnosis not present

## 2019-03-25 DIAGNOSIS — L821 Other seborrheic keratosis: Secondary | ICD-10-CM | POA: Diagnosis not present

## 2019-08-06 ENCOUNTER — Other Ambulatory Visit: Payer: Self-pay | Admitting: Internal Medicine

## 2019-08-06 DIAGNOSIS — Z1231 Encounter for screening mammogram for malignant neoplasm of breast: Secondary | ICD-10-CM

## 2019-08-11 ENCOUNTER — Ambulatory Visit
Admission: RE | Admit: 2019-08-11 | Discharge: 2019-08-11 | Disposition: A | Discharge status: Home / Self Care | Payer: 59 | Source: Ambulatory Visit | Attending: Internal Medicine | Admitting: Internal Medicine

## 2019-08-11 ENCOUNTER — Other Ambulatory Visit: Payer: Self-pay

## 2019-08-11 DIAGNOSIS — Z1231 Encounter for screening mammogram for malignant neoplasm of breast: Secondary | ICD-10-CM

## 2019-09-05 DIAGNOSIS — Z96643 Presence of artificial hip joint, bilateral: Secondary | ICD-10-CM | POA: Diagnosis not present

## 2019-09-05 DIAGNOSIS — Z96641 Presence of right artificial hip joint: Secondary | ICD-10-CM | POA: Diagnosis not present

## 2019-09-05 DIAGNOSIS — Z471 Aftercare following joint replacement surgery: Secondary | ICD-10-CM | POA: Diagnosis not present

## 2019-09-05 DIAGNOSIS — Z96642 Presence of left artificial hip joint: Secondary | ICD-10-CM | POA: Diagnosis not present

## 2019-09-05 DIAGNOSIS — M545 Low back pain: Secondary | ICD-10-CM | POA: Diagnosis not present

## 2019-11-18 DIAGNOSIS — R350 Frequency of micturition: Secondary | ICD-10-CM | POA: Diagnosis not present

## 2019-11-18 DIAGNOSIS — Z01419 Encounter for gynecological examination (general) (routine) without abnormal findings: Secondary | ICD-10-CM | POA: Diagnosis not present

## 2019-11-18 DIAGNOSIS — Z6825 Body mass index (BMI) 25.0-25.9, adult: Secondary | ICD-10-CM | POA: Diagnosis not present

## 2019-11-19 ENCOUNTER — Encounter: Payer: Self-pay | Admitting: Internal Medicine

## 2019-11-19 ENCOUNTER — Telehealth (INDEPENDENT_AMBULATORY_CARE_PROVIDER_SITE_OTHER): Payer: 59 | Admitting: Internal Medicine

## 2019-11-19 VITALS — Ht 64.0 in | Wt 147.0 lb

## 2019-11-19 DIAGNOSIS — M542 Cervicalgia: Secondary | ICD-10-CM | POA: Diagnosis not present

## 2019-11-19 DIAGNOSIS — M25519 Pain in unspecified shoulder: Secondary | ICD-10-CM | POA: Diagnosis not present

## 2019-11-19 DIAGNOSIS — M62838 Other muscle spasm: Secondary | ICD-10-CM | POA: Diagnosis not present

## 2019-11-19 MED ORDER — METHOCARBAMOL 500 MG PO TABS: 500.0000 mg | ORAL_TABLET | Freq: Three times a day (TID) | ORAL | 0 refills | Status: DC | PRN

## 2019-11-19 MED FILL — METHOCARBAMOL 500 MG TABS: 500 | 7 days supply | Qty: 40 | Fill #0

## 2019-11-19 NOTE — Progress Notes (Signed)
Virtual Visit via Video Note  I connected with@ on 11/19/19 at  9:30 AM EDT by a video enabled telemedicine application and verified that I am speaking with the correct person using two identifiers. Location patient: home Location provider:work office Persons participating in the virtual visit: patient, provider video quality on caregility substandard  [place] national recommendations  regarding COVID 19 pandemic   video visit is advised over in office visit for this patient.  Patient aware  of the limitations of evaluation and management by telemedicine and  availability of in person appointments. and agreed to proceed.   HPI: Katherine Rodriguez presents for video visit onset 5 dasy ago awoke  And had pain in neck upper left shoulder area  Like spasm without  fever  Trauma change in  acitivty and ares felt  Heavy  One am numbness in arms but gone with change in position .  Using heat and aleve some help but on going  No hx of same x ocass   "Slept wrong" sx    Worse with some  flexion and layin gon left side  ROS: See pertinent positives and negatives per HPI. No systemic sx   Past Medical History:  Diagnosis Date  . Abnormal Pap smear of anus   . Anemia 2010   hx of  . Arthritis   . Basal cell carcinoma    leg 2020  . Endometriosis   . GERD (gastroesophageal reflux disease) 2012   hx of, none recent  . History of blood transfusion 2010   no reaction  . History of herpes labialis 08/01/2011  . Periumbilical abdominal pain 03/11/2013   doubt uti but cx pending consdier small umbi hernia . refer  options of eval discussed    . Pneumonia 1994   hx of  . PPD positive   . Recurrent HSV (herpes simplex virus)   . S/P hip replacement 08/01/2011    Past Surgical History:  Procedure Laterality Date  . JOINT REPLACEMENT Right 2010   hip  . LAPAROTOMY  1980's, 1991   exploratory, laser for endometriosis  . THYROIDECTOMY, PARTIAL  1973  . TOTAL HIP ARTHROPLASTY Left 11/05/2013   Procedure: LEFT TOTAL HIP ARTHROPLASTY ANTERIOR APPROACH;  Surgeon: Gearlean Alf, MD;  Location: [facility];  Service: Orthopedics;  Laterality: Left;    Family History  Problem Relation Age of Onset  . Hypertension Mother   . Arthritis Mother        spinal stenosis  . Lymphoma Other   . Other Other        bowel disease  . Autoimmune disease Sister        cold agglutinin hemolytic anemia presumed autoimmune   . Breast cancer Neg Hx     Social History   Tobacco Use  . Smoking status: Former Smoker    Years: 15.00    Types: Cigarettes    Quit date: 08/21/1986    Years since quitting: 33.2  . Smokeless tobacco: Never Used  Substance Use Topics  . Alcohol use: Yes    Comment: rare  . Drug use: No      Current Outpatient Medications:  .  Cholecalciferol (VITAMIN D3) 5000 UNITS CAPS, Take by mouth. 1 CAPSULE THREE TIMES WEEKLY, Disp: , Rfl:  .  naproxen sodium (ALEVE) 220 MG tablet, , Disp: , Rfl:  .  polyethylene glycol powder (MIRALAX) 17 GM/SCOOP powder, , Disp: , Rfl:  .  vitamin C (ASCORBIC ACID) 500 MG tablet, Take 500  mg three times weekly, Disp: , Rfl:  .  loratadine (CLARITIN) 10 MG tablet, Take 10 mg daily by mouth., Disp: , Rfl:  .  methocarbamol (ROBAXIN) 500 MG tablet, Take 1-2 tablets (500-1,000 mg total) by mouth every 8 (eight) hours as needed for muscle spasms., Disp: 40 tablet, Rfl: 0  EXAM: BP Readings from Last 3 Encounters:  09/03/18 114/60  06/29/17 108/78  12/12/16 127/71    VITALS per patient if applicable:  GENERAL: alert, oriented, appears well and in no acute distress  HEENT: atraumatic, conjunttiva clear, no obvious abnormalities on inspection of external nose and ears  NECK: normal movements of the head and neck but not tested   LUNGS: on inspection no signs of respiratory distress, breathing rate appears normal, no obvious gross SOB, gasping or wheezing  CV: no obvious cyanosis  MS: moves all visible extremities without noticeable  abnormality  PSYCH/NEURO: pleasant and cooperative, no obvious depression or anxiety, speech and thought processing grossly intact   ASSESSMENT AND PLAN:  Discussed the following assessment and plan:    ICD-10-CM   1. Neck and shoulder pain  M54.2    M25.519   2. Muscle spasm  M62.838    Could be radicular but no alarm sx  Cervicogenic likely  Option to do PT  Declined to day  Plans massage   Disc neck hygeine  Add muscl relaxer at night if needed and  Gentle motion  Movement   If  persistent or progressive 2 weeks etc revisit  Consider PT or other eval  Counseled.   Expectant management and discussion of plan and treatment with opportunity to ask questions and all were answered. The patient agreed with the plan and demonstrated an understanding of the instructions.   Advised to call back or seek an in-person evaluation if worsening  or having  further concerns . Return if symptoms worsen or fail to improve as expected.    Shanon Ace, MD

## 2020-01-09 ENCOUNTER — Other Ambulatory Visit: Payer: Self-pay

## 2020-01-09 NOTE — Progress Notes (Signed)
This visit occurred during the SARS-CoV-2 public health emergency.  Safety protocols were in place, including screening questions prior to the visit, additional usage of staff PPE, and extensive cleaning of exam room while observing appropriate contact time as indicated for disinfecting solutions.    Chief Complaint  Patient presents with  . Neck Pain    base of skull on right side, ongoing dull ache  . Shoulder Pain    radiating into right shoulder and right jaw    HPI: Katherine Rodriguez 64 y.o. come in for  Problem based visit   Virtual visit in March and since then  Still having problems with nick pain right and intermittent tingling  Aching both hands without weakness  Has used some massage and stretching and postural modalities   Still  Pain righjt  Base of skull on down to trap area and shoudler  Spasm is better but  Annoying  usies aleve some help  No leg sx no trauma other systemic sx  ROS: See pertinent positives and negatives per HPI.  Past Medical History:  Diagnosis Date  . Abnormal Pap smear of anus   . Anemia 2010   hx of  . Arthritis   . Basal cell carcinoma    leg 2020  . Endometriosis   . GERD (gastroesophageal reflux disease) 2012   hx of, none recent  . History of blood transfusion 2010   no reaction  . History of herpes labialis 08/01/2011  . Periumbilical abdominal pain 03/11/2013   doubt uti but cx pending consdier small umbi hernia . refer  options of eval discussed    . Pneumonia 1994   hx of  . PPD positive   . Recurrent HSV (herpes simplex virus)   . S/P hip replacement 08/01/2011    Family History  Problem Relation Age of Onset  . Hypertension Mother   . Arthritis Mother        spinal stenosis  . Lymphoma Other   . Other Other        bowel disease  . Autoimmune disease Sister        cold agglutinin hemolytic anemia presumed autoimmune   . Breast cancer Neg Hx     Social History   Socioeconomic History  . Marital status: Married   Spouse name: Not on file  . Number of children: Not on file  . Years of education: Not on file  . Highest education level: Not on file  Occupational History  . Not on file  Tobacco Use  . Smoking status: Former Smoker    Years: 15.00    Types: Cigarettes    Quit date: 08/21/1986    Years since quitting: 33.4  . Smokeless tobacco: Never Used  Substance and Sexual Activity  . Alcohol use: Yes    Comment: rare  . Drug use: No  . Sexual activity: Not on file  Other Topics Concern  . Not on file  Social History Narrative   Married   Regular exercise- yes   Cutting back on caffeine   Sleep 7    HH 3 pet dog and cat.    Works hospital nursing   G4 P1    Social Determinants of Health   Financial Resource Strain:   . Difficulty of Paying Living Expenses:   Food Insecurity:   . Worried About Charity fundraiser in the Last Year:   . Arboriculturist in the Last Year:   Transportation Needs:   .  Lack of Transportation (Medical):   Marland Kitchen Lack of Transportation (Non-Medical):   Physical Activity:   . Days of Exercise per Week:   . Minutes of Exercise per Session:   Stress:   . Feeling of Stress :   Social Connections:   . Frequency of Communication with Friends and Family:   . Frequency of Social Gatherings with Friends and Family:   . Attends Religious Services:   . Active Member of Clubs or Organizations:   . Attends Archivist Meetings:   Marland Kitchen Marital Status:     Outpatient Medications Prior to Visit  Medication Sig Dispense Refill  . Cholecalciferol (VITAMIN D3) 5000 UNITS CAPS Take by mouth. 1 CAPSULE THREE TIMES WEEKLY    . loratadine (CLARITIN) 10 MG tablet Take 10 mg daily by mouth.    . methocarbamol (ROBAXIN) 500 MG tablet Take 1-2 tablets (500-1,000 mg total) by mouth every 8 (eight) hours as needed for muscle spasms. 40 tablet 0  . naproxen sodium (ALEVE) 220 MG tablet     . polyethylene glycol powder (MIRALAX) 17 GM/SCOOP powder     . vitamin C (ASCORBIC  ACID) 500 MG tablet Take 500 mg three times weekly     No facility-administered medications prior to visit.     EXAM:  BP 120/72   Pulse (!) 59   Temp 98.2 F (36.8 C) (Temporal)   Ht 5\' 4"  (1.626 m)   Wt 146 lb 6.4 oz (66.4 kg)   SpO2 98%   BMI 25.13 kg/m   Body mass index is 25.13 kg/m.  GENERAL: vitals reviewed and listed above, alert, oriented, appears well hydrated and in no acute distress HEENT: atraumatic, conjunctiva  clear, no obvious abnormalities on inspection of external nose and ears OP :masked  NECK:no masses tends  to hold tilt to left  No midline point tenderness  Tight trap ue nl strength and no atrophy noted  No masses    MS: moves all extremities without noticeable focal  abnormality PSYCH: pleasant and cooperative, no obvious depression or anxiety Lab Results  Component Value Date   WBC 7.2 09/03/2018   HGB 14.0 09/03/2018   HCT 41.7 09/03/2018   PLT 225.0 09/03/2018   GLUCOSE 85 09/03/2018   CHOL 182 09/03/2018   TRIG 121.0 09/03/2018   HDL 62.40 09/03/2018   LDLCALC 96 09/03/2018   ALT 20 09/03/2018   AST 21 09/03/2018   NA 142 09/03/2018   K 4.6 09/03/2018   CL 104 09/03/2018   CREATININE 0.73 09/03/2018   BUN 14 09/03/2018   CO2 31 09/03/2018   TSH 1.34 09/03/2018   INR 0.95 10/28/2013   BP Readings from Last 3 Encounters:  01/12/20 120/72  09/03/18 114/60  06/29/17 108/78    ASSESSMENT AND PLAN:  Discussed the following assessment and plan:  Right cervical radiculopathy ? - Plan: MR Cervical Spine Wo Contrast  Neck pain on right side - Plan: MR Cervical Spine Wo Contrast  Tingling of both upper extremities - Plan: MR Cervical Spine Wo Contrast  Cervicalgia - Plan: MR Cervical Spine Wo Contrast Failed conservative measures  Persistent after 3 months  Non traumatic  Sx  And ongoing radiating  Sx  On right  Disc imaging studies  And next step   Mri c cpine  -Patient advised to return or notify health care team  if  new concerns  arise. Outside external source  DATA REVIEWED:   Total time on date  of service including  record review ordering  counseling and plan of care:  30 minutes     Patient Instructions  Next step  Is  Imaging study .    As referral  Options. Failing    Conservative measures.      [name]. Margaree Sandhu M.D.

## 2020-01-12 ENCOUNTER — Ambulatory Visit (INDEPENDENT_AMBULATORY_CARE_PROVIDER_SITE_OTHER): Payer: Medicare Other | Admitting: Internal Medicine

## 2020-01-12 ENCOUNTER — Other Ambulatory Visit: Payer: Self-pay

## 2020-01-12 ENCOUNTER — Encounter: Payer: Self-pay | Admitting: Internal Medicine

## 2020-01-12 VITALS — BP 120/72 | HR 59 | Temp 98.2°F | Ht 64.0 in | Wt 146.4 lb

## 2020-01-12 DIAGNOSIS — R202 Paresthesia of skin: Secondary | ICD-10-CM

## 2020-01-12 DIAGNOSIS — M5412 Radiculopathy, cervical region: Secondary | ICD-10-CM | POA: Diagnosis not present

## 2020-01-12 DIAGNOSIS — M542 Cervicalgia: Secondary | ICD-10-CM

## 2020-01-12 NOTE — Patient Instructions (Addendum)
Next step  Is  Imaging study .    As referral  Options. Failing    Conservative measures.

## 2020-02-14 ENCOUNTER — Other Ambulatory Visit: Payer: Medicare Other

## 2020-02-19 MED FILL — DOXYCYCLINE HYC 100 MG CAPS: 100 | 10 days supply | Qty: 20 | Fill #0

## 2020-03-13 ENCOUNTER — Other Ambulatory Visit: Payer: Self-pay

## 2020-03-13 ENCOUNTER — Ambulatory Visit
Admission: RE | Admit: 2020-03-13 | Discharge: 2020-03-13 | Disposition: A | Discharge status: Home / Self Care | Payer: Medicare Other | Source: Ambulatory Visit | Attending: Internal Medicine | Admitting: Internal Medicine

## 2020-03-13 DIAGNOSIS — M542 Cervicalgia: Secondary | ICD-10-CM

## 2020-03-13 DIAGNOSIS — M5412 Radiculopathy, cervical region: Secondary | ICD-10-CM

## 2020-03-13 DIAGNOSIS — R202 Paresthesia of skin: Secondary | ICD-10-CM

## 2020-03-16 ENCOUNTER — Other Ambulatory Visit: Payer: Self-pay

## 2020-03-16 DIAGNOSIS — M5090 Cervical disc disorder, unspecified, unspecified cervical region: Secondary | ICD-10-CM

## 2020-03-16 DIAGNOSIS — M5410 Radiculopathy, site unspecified: Secondary | ICD-10-CM

## 2020-03-16 NOTE — Progress Notes (Signed)
So  degenerative disc disease and pinched nerves in c spine are probably causing your sx  but seems to be on the left and your symptoms are more on the right . Next step would be refer to NS or spine specialist to advise best interventions . Let us know if you want [place] to proceed with referral  and  if  you have  preferred practice providers

## 2020-04-07 ENCOUNTER — Telehealth: Payer: Self-pay | Admitting: Internal Medicine

## 2020-04-07 MED ORDER — VALACYCLOVIR HCL 1 G PO TABS: ORAL_TABLET | ORAL | 5 refills | Status: DC

## 2020-04-07 NOTE — Telephone Encounter (Signed)
She has had in past refilled

## 2020-04-07 NOTE — Addendum Note (Signed)
Addended byShanon Ace K on: 04/07/2020 07:08 PM   Modules accepted: Orders

## 2020-04-07 NOTE — Telephone Encounter (Signed)
Last OV 01/12/2020  I do not see this medication in her current medication list?

## 2020-04-07 NOTE — Telephone Encounter (Signed)
Pt asked for a refill   Medication refill: Valtrex  Pharmacy:  [id number] - [place], [address] Phone:  [phone]  Fax:  [phone]

## 2020-04-08 NOTE — Telephone Encounter (Signed)
Medication was sent to the pharmacy requested by Dr. Regis Bill yesterday.

## 2020-07-30 ENCOUNTER — Other Ambulatory Visit: Payer: Self-pay | Admitting: Internal Medicine

## 2020-07-30 DIAGNOSIS — Z1231 Encounter for screening mammogram for malignant neoplasm of breast: Secondary | ICD-10-CM

## 2020-08-18 ENCOUNTER — Encounter: Payer: Medicare Other | Admitting: Internal Medicine

## 2020-08-22 NOTE — Progress Notes (Signed)
Chief Complaint  Patient presents with  . Annual Exam    CPE/labs. Not fasting today, c/o having lots of gas and constipation.  Has been taking benefiber and probiotic with some help. Marland Kitchen     HPI: Katherine Rodriguez 65 y.o. comes in today for yearly visit .Since last visit. She is fully retired and doing pretty well.  Couple issues to follow   Bone density last done 3 years ago family history try to take vitamin D and vitamin C  cervical  Spine disease   Doing specific    Pt  Y    [place] town.  As per Dr. Danielle Dess and that was much more manageable for her cervical spine disease  Sluggish  Constipation issues   Some times gas.  Not bloating .  Using some  miralax and now bene fiber .   Gu  Seen urology   divertulitis  myrbetric helped.  Her GU pressure urinary symptoms and she is using it as needed.    Health Maintenance  Topic Date Due  . TETANUS/TDAP  05/12/2019  . COVID-19 Vaccine (3 - Booster for Pfizer series) 03/04/2020  . MAMMOGRAM  08/10/2021  . PNA vac Low Risk Adult (2 of 2 - PPSV23) 08/23/2021  . COLONOSCOPY (Pts 45-54yrs Insurance coverage will need to be confirmed)  10/15/2021  . INFLUENZA VACCINE  Completed  . DEXA SCAN  Completed  . Hepatitis C Screening  Completed   Health Maintenance Review LIFESTYLE:  Exercise:  Recently  more walking .  pilates  Tobacco/ETS: no Alcohol:  Rare  Sugar beverages: rare Sleep:  Ave 7 hours  Drug use: no HH: 2  1 cat  Fully retired in April 2021  Vit c and D 3    Hearing: ok  Vision:  No limitations at present . Last eye check UTD  Safety:  Has smoke detector and wears seat belts.  . No excess sun exposure. Sees dentist regularly.  Falls: n  Memory: Felt to be good  , no concern from her or her family.  Depression: No anhedonia unusual crying or depressive symptoms  Nutrition: Eats well balanced diet; adequate calcium and vitamin D. No swallowing chewing problems.  Injury: no major injuries in the last six  months.  Other healthcare providers:  Reviewed   Preventive parameters: up-to-date  Reviewed   ADLS:   There are no problems or need for assistance  driving, feeding, obtaining food, dressing, toileting and bathing, managing money using phone. She is independent.   ROS:  GEN/ HEENT: No fever, significant weight changes sweats headaches vision problems hearing changes, CV/ PULM; No chest pain shortness of breath cough, syncope,edema  change in exercise tolerance. GI /GU: No adominal pain, vomiting, \. No blood in the stool.  SKIN/HEME: ,no acute skin rashes suspicious lesions or bleeding. No lymphadenopathy, nodules, masses.  NEURO/ PSYCH:  No neurologic signs such as weakness numbness. No depression anxiety. IMM/ Allergy: No unusual infections.  Allergy .   REST of 12 system review negative except as per HPI   Past Medical History:  Diagnosis Date  . Abnormal Pap smear of anus   . Anemia 2010   hx of  . Arthritis   . Basal cell carcinoma    leg 2020  . Endometriosis   . GERD (gastroesophageal reflux disease) 2012   hx of, none recent  . History of blood transfusion 2010   no reaction  . History of herpes labialis 08/01/2011  . Periumbilical abdominal  pain 03/11/2013   doubt uti but cx pending consdier small umbi hernia . refer  options of eval discussed    . Pneumonia 1994   hx of  . PPD positive   . Recurrent HSV (herpes simplex virus)   . S/P hip replacement 08/01/2011    Family History  Problem Relation Age of Onset  . Hypertension Mother   . Arthritis Mother        spinal stenosis  . Lymphoma Other   . Other Other        bowel disease  . Autoimmune disease Sister        cold agglutinin hemolytic anemia presumed autoimmune   . Breast cancer Neg Hx     Social History   Socioeconomic History  . Marital status: Married    Spouse name: Not on file  . Number of children: Not on file  . Years of education: Not on file  . Highest education level: Not on file   Occupational History  . Not on file  Tobacco Use  . Smoking status: Former Smoker    Years: 15.00    Types: Cigarettes    Quit date: 08/21/1986    Years since quitting: 34.0  . Smokeless tobacco: Never Used  Vaping Use  . Vaping Use: Never used  Substance and Sexual Activity  . Alcohol use: Yes    Comment: rare  . Drug use: No  . Sexual activity: Yes  Other Topics Concern  . Not on file  Social History Narrative   Married   Regular exercise- yes   Cutting back on caffeine   Sleep 7    HH 3 pet dog and cat.    Works hospital nursing   G4 P1    Social Determinants of [place] Strain: Not on file  Food Insecurity: Not on file  Transportation Needs: Not on file  Physical Activity: Not on file  Stress: Not on file  Social Connections: Not on file    Outpatient Encounter Medications as of 08/23/2020  Medication Sig  . Cholecalciferol (VITAMIN D3) 5000 UNITS CAPS Take by mouth. 1 CAPSULE THREE TIMES WEEKLY  . loratadine (CLARITIN) 10 MG tablet Take 10 mg daily by mouth.  . Multiple Vitamins-Minerals (ALGAE BASED CALCIUM PO) Take by mouth.  . naproxen sodium (ALEVE) 220 MG tablet   . polyethylene glycol powder (GLYCOLAX/MIRALAX) 17 GM/SCOOP powder   . valACYclovir (VALTREX) 1000 MG tablet TAKE 2 TABLETS BY MOUTH TWICE A DAY AS NEEDED FOR FEVER BLISTERS  . vitamin C (ASCORBIC ACID) 500 MG tablet Take 500 mg three times weekly  . methocarbamol (ROBAXIN) 500 MG tablet Take 1-2 tablets (500-1,000 mg total) by mouth every 8 (eight) hours as needed for muscle spasms. (Patient not taking: Reported on 08/23/2020)  . MYRBETRIQ 50 MG TB24 tablet Take 50 mg by mouth daily.   No facility-administered encounter medications on file as of 08/23/2020.    EXAM:  BP 118/74   Pulse 66   Temp 97.9 F (36.6 C) (Oral)   Ht 5' 3.5" (1.613 m)   Wt 149 lb (67.6 kg)   SpO2 97%   BMI 25.98 kg/m   Body mass index is 25.98 kg/m.  Physical Exam: Vital signs  reviewed [id number] is a well-developed well-nourished alert cooperative   who appears stated age in no acute distress.  HEENT: normocephalic atraumatic , Eyes: PERRL EOM's full, conjunctiva clear, Nares: paten,t no deformity discharge or tenderness., Ears: no deformity EAC's clear  TMs with normal landmarks. Mouth: clear OP,masked NECK: supple without masses, thyromegaly or bruits. CHEST/PULM:  Clear to auscultation and percussion breath sounds equal no wheeze , rales or rhonchi. No chest wall deformities or tenderness. CV: PMI is nondisplaced, S1 S2 no gallops, murmurs, rubs. Peripheral pulses are full without delay.No JVD .  ABDOMEN: Bowel sounds normal nontender  No guard or rebound, no hepato splenomegal no CVA tenderness.   Extremtities:  No clubbing cyanosis or edema, no acute joint swelling or redness no focal atrophy NEURO:  Oriented x3, cranial nerves 3-12 appear to be intact, no obvious focal weakness,gait within normal limits no abnormal reflexes or asymmetrical SKIN: No acute rashes normal turgor, color, no bruising or petechiae. PSYCH: Oriented, good eye contact, no obvious depression anxiety, cognition and judgment appear normal. LN: no cervical axillary inguinal adenopathy No noted deficits in memory, attention, and speech.   Lab Results  Component Value Date   WBC 7.2 09/03/2018   HGB 14.0 09/03/2018   HCT 41.7 09/03/2018   PLT 225.0 09/03/2018   GLUCOSE 85 09/03/2018   CHOL 182 09/03/2018   TRIG 121.0 09/03/2018   HDL 62.40 09/03/2018   LDLCALC 96 09/03/2018   ALT 20 09/03/2018   AST 21 09/03/2018   NA 142 09/03/2018   K 4.6 09/03/2018   CL 104 09/03/2018   CREATININE 0.73 09/03/2018   BUN 14 09/03/2018   CO2 31 09/03/2018   TSH 1.34 09/03/2018   INR 0.95 10/28/2013  ate   Cheese toast and nuts  2 hours  .   ASSESSMENT AND PLAN:  Discussed the following assessment and plan:  Osteoporosis without current pathological fracture, unspecified osteoporosis type -  Plan: DG Bone Density, TSH, VITAMIN D 25 Hydroxy (Vit-D Deficiency, Fractures), Comprehensive metabolic panel, CBC with Differential/Platelet, CBC with Differential/Platelet, Comprehensive metabolic panel, VITAMIN D 25 Hydroxy (Vit-D Deficiency, Fractures), TSH, CANCELED: CBC with Differential/Platelet, CANCELED: Comprehensive metabolic panel, CANCELED: VITAMIN D 25 Hydroxy (Vit-D Deficiency, Fractures), CANCELED: TSH  Medication management - Plan: TSH, VITAMIN D 25 Hydroxy (Vit-D Deficiency, Fractures), Comprehensive metabolic panel, CBC with Differential/Platelet, CBC with Differential/Platelet, Comprehensive metabolic panel, VITAMIN D 25 Hydroxy (Vit-D Deficiency, Fractures), TSH, CANCELED: CBC with Differential/Platelet, CANCELED: Comprehensive metabolic panel, CANCELED: VITAMIN D 25 Hydroxy (Vit-D Deficiency, Fractures), CANCELED: TSH  Status post hip replacement, unspecified laterality - Plan: TSH, VITAMIN D 25 Hydroxy (Vit-D Deficiency, Fractures), Comprehensive metabolic panel, CBC with Differential/Platelet, CBC with Differential/Platelet, Comprehensive metabolic panel, VITAMIN D 25 Hydroxy (Vit-D Deficiency, Fractures), TSH, CANCELED: CBC with Differential/Platelet, CANCELED: Comprehensive metabolic panel, CANCELED: VITAMIN D 25 Hydroxy (Vit-D Deficiency, Fractures), CANCELED: TSH  Estrogen deficiency - Plan: DG Bone Density, TSH, VITAMIN D 25 Hydroxy (Vit-D Deficiency, Fractures), Comprehensive metabolic panel, CBC with Differential/Platelet, CBC with Differential/Platelet, Comprehensive metabolic panel, VITAMIN D 25 Hydroxy (Vit-D Deficiency, Fractures), TSH, CANCELED: CBC with Differential/Platelet, CANCELED: Comprehensive metabolic panel, CANCELED: VITAMIN D 25 Hydroxy (Vit-D Deficiency, Fractures), CANCELED: TSH  Cervical disc disease - Managing - Plan: TSH, VITAMIN D 25 Hydroxy (Vit-D Deficiency, Fractures), Comprehensive metabolic panel, CBC with Differential/Platelet, CBC with  Differential/Platelet, Comprehensive metabolic panel, VITAMIN D 25 Hydroxy (Vit-D Deficiency, Fractures), TSH, CANCELED: CBC with Differential/Platelet, CANCELED: Comprehensive metabolic panel, CANCELED: VITAMIN D 25 Hydroxy (Vit-D Deficiency, Fractures), CANCELED: TSH  GI symptom - Sounds like managing and not alarming follow-up if progressive - Plan: TSH, VITAMIN D 25 Hydroxy (Vit-D Deficiency, Fractures), Comprehensive metabolic panel, CBC with Differential/Platelet, CBC with Differential/Platelet, Comprehensive metabolic panel, VITAMIN D 25 Hydroxy (Vit-D Deficiency, Fractures), TSH, CANCELED: CBC  with Differential/Platelet, CANCELED: Comprehensive metabolic panel, CANCELED: VITAMIN D 25 Hydroxy (Vit-D Deficiency, Fractures), CANCELED: TSH  Need for pneumococcal vaccination - Plan: Pneumococcal conjugate vaccine 13-valent Prevnar 13 today get Pneumovax next year  Patient Care Team: Burnis Medin, MD as PCP - Dossie Arbour, MD (Inactive) (Gastroenterology) Avon Gully, NP as Nurse Practitioner (Obstetrics and Gynecology) Gaynelle Arabian, MD as Consulting Physician (Orthopedic Surgery) Sydnee Levans, MD as Referring Physician (Dermatology)  Patient Instructions  Continue lifestyle intervention healthy eating and exercise . Let us know if  Symptoms problem  Ordered updated bone density .   Will notify you  of labs when available.   prevnar 13 today   ( get pneumovax next year)    Health Maintenance, Female Adopting a healthy lifestyle and getting preventive care are important in promoting health and wellness. Ask your health care provider about:  The right schedule for you to have regular tests and exams.  Things you can do on your own to prevent diseases and keep yourself healthy. What should I know about diet, weight, and exercise? Eat a healthy diet   Eat a diet that includes plenty of vegetables, fruits, low-fat dairy products, and lean protein.  Do not eat  a lot of foods that are high in solid fats, added sugars, or sodium. Maintain a healthy weight Body mass index (BMI) is used to identify weight problems. It estimates body fat based on height and weight. Your health care provider can help determine your BMI and help you achieve or maintain a healthy weight. Get regular exercise Get regular exercise. This is one of the most important things you can do for your health. Most adults should:  Exercise for at least 150 minutes each week. The exercise should increase your heart rate and make you sweat (moderate-intensity exercise).  Do strengthening exercises at least twice a week. This is in addition to the moderate-intensity exercise.  Spend less time sitting. Even light physical activity can be beneficial. Watch cholesterol and blood lipids Have your blood tested for lipids and cholesterol at 67 years of age, then have this test every 5 years. Have your cholesterol levels checked more often if:  Your lipid or cholesterol levels are high.  You are older than 67 years of age.  You are at high risk for heart disease. What should I know about cancer screening? Depending on your health history and family history, you may need to have cancer screening at various ages. This may include screening for:  Breast cancer.  Cervical cancer.  Colorectal cancer.  Skin cancer.  Lung cancer. What should I know about heart disease, diabetes, and high blood pressure? Blood pressure and heart disease  High blood pressure causes heart disease and increases the risk of stroke. This is more likely to develop in people who have high blood pressure readings, are of African descent, or are overweight.  Have your blood pressure checked: ? Every 3-5 years if you are 37-79 years of age. ? Every year if you are 54 years old or older. Diabetes Have regular diabetes screenings. This checks your fasting blood sugar level. Have the screening done:  Once every  three years after age 7 if you are at a normal weight and have a low risk for diabetes.  More often and at a younger age if you are overweight or have a high risk for diabetes. What should I know about preventing infection? Hepatitis B If you have a higher risk for hepatitis B, you  should be screened for this virus. Talk with your health care provider to find out if you are at risk for hepatitis B infection. Hepatitis C Testing is recommended for:  Everyone born from [birth year] through 1967.  Anyone with known risk factors for hepatitis C. Sexually transmitted infections (STIs)  Get screened for STIs, including gonorrhea and chlamydia, if: ? You are sexually active and are younger than 67 years of age. ? You are older than 67 years of age and your health care provider tells you that you are at risk for this type of infection. ? Your sexual activity has changed since you were last screened, and you are at increased risk for chlamydia or gonorrhea. Ask your health care provider if you are at risk.  Ask your health care provider about whether you are at high risk for HIV. Your health care provider may recommend a prescription medicine to help prevent HIV infection. If you choose to take medicine to prevent HIV, you should first get tested for HIV. You should then be tested every 3 months for as long as you are taking the medicine. Pregnancy  If you are about to stop having your period (premenopausal) and you may become pregnant, seek counseling before you get pregnant.  Take 400 to 800 micrograms (mcg) of folic acid every day if you become pregnant.  Ask for birth control (contraception) if you want to prevent pregnancy. Osteoporosis and menopause Osteoporosis is a disease in which the bones lose minerals and strength with aging. This can result in bone fractures. If you are 66 years old or older, or if you are at risk for osteoporosis and fractures, ask your health care provider if you  should:  Be screened for bone loss.  Take a calcium or vitamin D supplement to lower your risk of fractures.  Be given hormone replacement therapy (HRT) to treat symptoms of menopause. Follow these instructions at home: Lifestyle  Do not use any products that contain nicotine or tobacco, such as cigarettes, e-cigarettes, and chewing tobacco. If you need help quitting, ask your health care provider.  Do not use street drugs.  Do not share needles.  Ask your health care provider for help if you need support or information about quitting drugs. Alcohol use  Do not drink alcohol if: ? Your health care provider tells you not to drink. ? You are pregnant, may be pregnant, or are planning to become pregnant.  If you drink alcohol: ? Limit how much you use to 0-1 drink a day. ? Limit intake if you are breastfeeding.  Be aware of how much alcohol is in your drink. In the [place] one drink equals one 12 oz bottle of beer (355 mL), one 5 oz glass of wine (148 mL), or one 1 oz glass of hard liquor (44 mL). General instructions  Schedule regular health, dental, and eye exams.  Stay current with your vaccines.  Tell your health care provider if: ? You often feel depressed. ? You have ever been abused or do not feel safe at home. Summary  Adopting a healthy lifestyle and getting preventive care are important in promoting health and wellness.  Follow your health care provider's instructions about healthy diet, exercising, and getting tested or screened for diseases.  Follow your health care provider's instructions on monitoring your cholesterol and blood pressure. This information is not intended to replace advice given to you by your health care provider. Make sure you discuss any questions you have with your  health care provider. Document Revised: 07/31/2018 Document Reviewed: 07/31/2018 Elsevier Patient Education  2020 [place] Daivd Fredericksen M.D.

## 2020-08-23 ENCOUNTER — Other Ambulatory Visit: Payer: Self-pay

## 2020-08-23 ENCOUNTER — Ambulatory Visit (INDEPENDENT_AMBULATORY_CARE_PROVIDER_SITE_OTHER): Payer: Medicare Other | Admitting: Internal Medicine

## 2020-08-23 ENCOUNTER — Encounter: Payer: Self-pay | Admitting: Internal Medicine

## 2020-08-23 VITALS — BP 118/74 | HR 66 | Temp 97.9°F | Ht 63.5 in | Wt 149.0 lb

## 2020-08-23 DIAGNOSIS — Z96649 Presence of unspecified artificial hip joint: Secondary | ICD-10-CM

## 2020-08-23 DIAGNOSIS — E2839 Other primary ovarian failure: Secondary | ICD-10-CM | POA: Diagnosis not present

## 2020-08-23 DIAGNOSIS — R198 Other specified symptoms and signs involving the digestive system and abdomen: Secondary | ICD-10-CM

## 2020-08-23 DIAGNOSIS — M5090 Cervical disc disorder, unspecified, unspecified cervical region: Secondary | ICD-10-CM

## 2020-08-23 DIAGNOSIS — M810 Age-related osteoporosis without current pathological fracture: Secondary | ICD-10-CM

## 2020-08-23 DIAGNOSIS — Z23 Encounter for immunization: Secondary | ICD-10-CM | POA: Diagnosis not present

## 2020-08-23 DIAGNOSIS — Z79899 Other long term (current) drug therapy: Secondary | ICD-10-CM

## 2020-08-23 LAB — COMPREHENSIVE METABOLIC PANEL
ALT: 21 U/L (ref 0–35)
AST: 21 U/L (ref 0–37)
Albumin: 4.7 g/dL (ref 3.5–5.2)
Alkaline Phosphatase: 69 U/L (ref 39–117)
BUN: 18 mg/dL (ref 6–23)
CO2: 32 mEq/L (ref 19–32)
Calcium: 9.4 mg/dL (ref 8.4–10.5)
Chloride: 101 mEq/L (ref 96–112)
Creatinine, Ser: 0.75 mg/dL (ref 0.40–1.20)
GFR: 83.16 mL/min (ref >60.00)
Glucose, Bld: 90 mg/dL (ref 70–99)
Potassium: 4.5 mEq/L (ref 3.5–5.1)
Sodium: 139 mEq/L (ref 135–145)
Total Bilirubin: 0.4 mg/dL (ref 0.2–1.2)
Total Protein: 6.7 g/dL (ref 6.0–8.3)

## 2020-08-23 LAB — CBC WITH DIFFERENTIAL/PLATELET
Basophils Absolute: 0 10*3/uL (ref 0.0–0.1)
Basophils Relative: 0.8 % (ref 0.0–3.0)
Eosinophils Absolute: 0.1 10*3/uL (ref 0.0–0.7)
Eosinophils Relative: 2.3 % (ref 0.0–5.0)
HCT: 41.8 % (ref 36.0–46.0)
Hemoglobin: 13.7 g/dL (ref 12.0–15.0)
Lymphocytes Relative: 28.6 % (ref 12.0–46.0)
Lymphs Abs: 1.6 10*3/uL (ref 0.7–4.0)
MCHC: 32.8 g/dL (ref 30.0–36.0)
MCV: 92.7 fl (ref 78.0–100.0)
Monocytes Absolute: 0.6 10*3/uL (ref 0.1–1.0)
Monocytes Relative: 10 % (ref 3.0–12.0)
Neutro Abs: 3.3 10*3/uL (ref 1.4–7.7)
Neutrophils Relative %: 58.3 % (ref 43.0–77.0)
Platelets: 251 10*3/uL (ref 150.0–400.0)
RBC: 4.5 Mil/uL (ref 3.87–5.11)
RDW: 12.8 % (ref 11.5–15.5)
WBC: 5.7 10*3/uL (ref 4.0–10.5)

## 2020-08-23 NOTE — Patient Instructions (Signed)
Continue lifestyle intervention healthy eating and exercise . Let us know if  Symptoms problem  Ordered updated bone density .   Will notify you  of labs when available.   prevnar 13 today   ( get pneumovax next year)    Health Maintenance, Female Adopting a healthy lifestyle and getting preventive care are important in promoting health and wellness. Ask your health care provider about:  The right schedule for you to have regular tests and exams.  Things you can do on your own to prevent diseases and keep yourself healthy. What should I know about diet, weight, and exercise? Eat a healthy diet   Eat a diet that includes plenty of vegetables, fruits, low-fat dairy products, and lean protein.  Do not eat a lot of foods that are high in solid fats, added sugars, or sodium. Maintain a healthy weight Body mass index (BMI) is used to identify weight problems. It estimates body fat based on height and weight. Your health care provider can help determine your BMI and help you achieve or maintain a healthy weight. Get regular exercise Get regular exercise. This is one of the most important things you can do for your health. Most adults should:  Exercise for at least 150 minutes each week. The exercise should increase your heart rate and make you sweat (moderate-intensity exercise).  Do strengthening exercises at least twice a week. This is in addition to the moderate-intensity exercise.  Spend less time sitting. Even light physical activity can be beneficial. Watch cholesterol and blood lipids Have your blood tested for lipids and cholesterol at 67 years of age, then have this test every 5 years. Have your cholesterol levels checked more often if:  Your lipid or cholesterol levels are high.  You are older than 67 years of age.  You are at high risk for heart disease. What should I know about cancer screening? Depending on your health history and family history, you may need to have  cancer screening at various ages. This may include screening for:  Breast cancer.  Cervical cancer.  Colorectal cancer.  Skin cancer.  Lung cancer. What should I know about heart disease, diabetes, and high blood pressure? Blood pressure and heart disease  High blood pressure causes heart disease and increases the risk of stroke. This is more likely to develop in people who have high blood pressure readings, are of African descent, or are overweight.  Have your blood pressure checked: ? Every 3-5 years if you are 48-56 years of age. ? Every year if you are 70 years old or older. Diabetes Have regular diabetes screenings. This checks your fasting blood sugar level. Have the screening done:  Once every three years after age 27 if you are at a normal weight and have a low risk for diabetes.  More often and at a younger age if you are overweight or have a high risk for diabetes. What should I know about preventing infection? Hepatitis B If you have a higher risk for hepatitis B, you should be screened for this virus. Talk with your health care provider to find out if you are at risk for hepatitis B infection. Hepatitis C Testing is recommended for:  Everyone born from [birth year] through 1967.  Anyone with known risk factors for hepatitis C. Sexually transmitted infections (STIs)  Get screened for STIs, including gonorrhea and chlamydia, if: ? You are sexually active and are younger than 68 years of age. ? You are older than 67 years  of age and your health care provider tells you that you are at risk for this type of infection. ? Your sexual activity has changed since you were last screened, and you are at increased risk for chlamydia or gonorrhea. Ask your health care provider if you are at risk.  Ask your health care provider about whether you are at high risk for HIV. Your health care provider may recommend a prescription medicine to help prevent HIV infection. If you choose to take  medicine to prevent HIV, you should first get tested for HIV. You should then be tested every 3 months for as long as you are taking the medicine. Pregnancy  If you are about to stop having your period (premenopausal) and you may become pregnant, seek counseling before you get pregnant.  Take 400 to 800 micrograms (mcg) of folic acid every day if you become pregnant.  Ask for birth control (contraception) if you want to prevent pregnancy. Osteoporosis and menopause Osteoporosis is a disease in which the bones lose minerals and strength with aging. This can result in bone fractures. If you are 71 years old or older, or if you are at risk for osteoporosis and fractures, ask your health care provider if you should:  Be screened for bone loss.  Take a calcium or vitamin D supplement to lower your risk of fractures.  Be given hormone replacement therapy (HRT) to treat symptoms of menopause. Follow these instructions at home: Lifestyle  Do not use any products that contain nicotine or tobacco, such as cigarettes, e-cigarettes, and chewing tobacco. If you need help quitting, ask your health care provider.  Do not use street drugs.  Do not share needles.  Ask your health care provider for help if you need support or information about quitting drugs. Alcohol use  Do not drink alcohol if: ? Your health care provider tells you not to drink. ? You are pregnant, may be pregnant, or are planning to become pregnant.  If you drink alcohol: ? Limit how much you use to 0-1 drink a day. ? Limit intake if you are breastfeeding.  Be aware of how much alcohol is in your drink. In the [place] one drink equals one 12 oz bottle of beer (355 mL), one 5 oz glass of wine (148 mL), or one 1 oz glass of hard liquor (44 mL). General instructions  Schedule regular health, dental, and eye exams.  Stay current with your vaccines.  Tell your health care provider if: ? You often feel depressed. ? You have  ever been abused or do not feel safe at home. Summary  Adopting a healthy lifestyle and getting preventive care are important in promoting health and wellness.  Follow your health care provider's instructions about healthy diet, exercising, and getting tested or screened for diseases.  Follow your health care provider's instructions on monitoring your cholesterol and blood pressure. This information is not intended to replace advice given to you by your health care provider. Make sure you discuss any questions you have with your health care provider. Document Revised: 07/31/2018 Document Reviewed: 07/31/2018 Elsevier Patient Education  2020 [employer].

## 2020-08-24 LAB — TSH: TSH: 1.7 u[IU]/mL (ref 0.35–4.50)

## 2020-08-24 LAB — VITAMIN D 25 HYDROXY (VIT D DEFICIENCY, FRACTURES): VITD: 55.57 ng/mL (ref 30.00–100.00)

## 2020-08-24 NOTE — Progress Notes (Signed)
Results normal  vit D in  normal range

## 2020-08-25 NOTE — Progress Notes (Signed)
Mychart message sent: Results normal  vit D in  normal range

## 2020-09-10 ENCOUNTER — Other Ambulatory Visit: Payer: Self-pay

## 2020-09-10 ENCOUNTER — Ambulatory Visit
Admission: RE | Admit: 2020-09-10 | Discharge: 2020-09-10 | Disposition: A | Discharge status: Home / Self Care | Payer: Medicare Other | Source: Ambulatory Visit | Attending: Internal Medicine | Admitting: Internal Medicine

## 2020-09-10 DIAGNOSIS — Z1231 Encounter for screening mammogram for malignant neoplasm of breast: Secondary | ICD-10-CM

## 2020-11-17 ENCOUNTER — Other Ambulatory Visit: Payer: Medicare Other

## 2020-12-22 ENCOUNTER — Other Ambulatory Visit: Payer: Self-pay

## 2020-12-22 ENCOUNTER — Ambulatory Visit
Admission: RE | Admit: 2020-12-22 | Discharge: 2020-12-22 | Disposition: A | Discharge status: Home / Self Care | Payer: Medicare Other | Source: Ambulatory Visit | Attending: Internal Medicine | Admitting: Internal Medicine

## 2020-12-22 DIAGNOSIS — M810 Age-related osteoporosis without current pathological fracture: Secondary | ICD-10-CM

## 2020-12-22 DIAGNOSIS — E2839 Other primary ovarian failure: Secondary | ICD-10-CM

## 2021-01-20 ENCOUNTER — Telehealth: Payer: Medicare Other | Admitting: Physician Assistant

## 2021-01-20 DIAGNOSIS — L255 Unspecified contact dermatitis due to plants, except food: Secondary | ICD-10-CM

## 2021-01-20 MED ORDER — PREDNISONE 10 MG PO TABS: ORAL_TABLET | ORAL | 0 refills | Status: AC

## 2021-01-20 NOTE — Progress Notes (Signed)
I have spent 5 minutes in review of e-visit questionnaire, review and updating patient chart, medical decision making and response to patient.   Aceson Labell Cody Rushie Brazel, PA-C    

## 2021-01-20 NOTE — Progress Notes (Signed)
E-Visit for Poison Ivy  We are sorry that you are not feeing well.  Here is how we plan to help!  Based on what you have shared with me it looks like you have had an allergic reaction to the oily resin from a group of plants.  This resin is very sticky, so it easily attaches to your skin, clothing, tools equipment, and pet's fur.    This blistering rash is often called poison ivy rash although it can come from contact with the leaves, stems and roots of poison ivy, poison oak and poison sumac.  The oily resin contains urushiol (u-ROO-she-ol) that produces a skin rash on exposed skin.  The severity of the rash depends on the amount of urushiol that gets on your skin.  A section of skin with more urushiol on it may develop a rash sooner.  The rash usually develops 12-48 hours after exposure and can last two to three weeks.  Your skin must come in direct contact with the plant's oil to be affected.  Blister fluid doesn't spread the rash.  However, if you come into contact with a piece of clothing or pet fur that has urushiol on it, the rash may spread out.  You can also transfer the oil to other parts of your body with your fingers.  Often the rash looks like a straight line because of the way the plant brushes against your skin.  Since your rash is widespread or has resulted in a large number of blisters, I have prescribed an oral corticosteroid.  Please follow these recommendations:  I have sent a prednisone dose pack to your chosen pharmacy. Be sure to follow the instructions carefully and complete the entire prescription. You may use Benadryl or Caladryl topical lotions to sooth the itch and remember cool, not hot, showers and baths can help relieve the itching!  Place cool, wet compresses on the affected area for 15-30 minutes several times a day.  You may also take oral antihistamines, such as diphenhydramine (Benadryl, others), which may also help you sleep better.  Watch your skin for any purulent  (pus) drainage or red streaking from the site.  If this occurs, contact your provider.  You may require an antibiotic for a skin infection.  Make sure that the clothes you were wearing as well as any towels or sheets that may have come in contact with the oil (urushiol) are washed in detergent and hot water.       I have developed the following plan to treat your condition I am prescribing a two week course of steroids (37 tablets of 10 mg prednisone).  Days 1-4 take 4 tablets (40 mg) daily  Days 5-8 take 3 tablets (30 mg) daily, Days 9-11 take 2 tablets (20 mg) daily, Days 12-14 take 1 tablet (10 mg) daily.    What can you do to prevent this rash?  Avoid the plants.  Learn how to identify poison ivy, poison oak and poison sumac in all seasons.  When hiking or engaging in other activities that might expose you to these plants, try to stay on cleared pathways.  If camping, make sure you pitch your tent in an area free of these plants.  Keep pets from running through wooded areas so that urushiol doesn't accidentally stick to their fur, which you may touch.  Remove or kill the plants.  In your yard, you can get rid of poison ivy by applying an herbicide or pulling it out of   the ground, including the roots, while wearing heavy gloves.  Afterward remove the gloves and thoroughly wash them and your hands.  Don't burn poison ivy or related plants because the urushiol can be carried by smoke.  Wear protective clothing.  If needed, protect your skin by wearing socks, boots, pants, long sleeves and vinyl gloves.  Wash your skin right away.  Washing off the oil with soap and water within 30 minutes of exposure may reduce your chances of getting a poison ivy rash.  Even washing after an hour or so can help reduce the severity of the rash.  If you walk through some poison ivy and then later touch your shoes, you may get some urushiol on your hands, which may then transfer to your face or body by touching or  rubbing.  If the contaminated object isn't cleaned, the urushiol on it can still cause a skin reaction years later.    Be careful not to reuse towels after you have washed your skin.  Also carefully wash clothing in detergent and hot water to remove all traces of the oil.  Handle contaminated clothing carefully so you don't transfer the urushiol to yourself, furniture, rugs or appliances.  Remember that pets can carry the oil on their fur and paws.  If you think your pet may be contaminated with urushiol, put on some long rubber gloves and give your pet a bath.  Finally, be careful not to burn these plants as the smoke can contain traces of the oil.  Inhaling the smoke may result in difficulty breathing. If that occurred you should see a physician as soon as possible.  See your doctor right away if:   The reaction is severe or widespread  You inhaled the smoke from burning poison ivy and are having difficulty breathing  Your skin continues to swell  The rash affects your eyes, mouth or genitals  Blisters are oozing pus  You develop a fever greater than 100 F (37.8 C)  The rash doesn't get better within a few weeks.  If you scratch the poison ivy rash, bacteria under your fingernails may cause the skin to become infected.  See your doctor if pus starts oozing from the blisters.  Treatment generally includes antibiotics.  Poison ivy treatments are usually limited to self-care methods.  And the rash typically goes away on its own in two to three weeks.     If the rash is widespread or results in a large number of blisters, your doctor may prescribe an oral corticosteroid, such as prednisone.  If a bacterial infection has developed at the rash site, your doctor may give you a prescription for an oral antibiotic.  MAKE SURE YOU   Understand these instructions.  Will watch your condition.  Will get help right away if you are not doing well or get worse.  Thank you for choosing an  e-visit. Your e-visit answers were reviewed by a board certified advanced clinical practitioner to complete your personal care plan. Depending upon the condition, your plan could have included both over the counter or prescription medications.  Please review your pharmacy choice. If there is a problem you may use MyChart messaging to have the prescription routed to another pharmacy.   Your safety is important to us. If you have drug allergies check your prescription carefully.  You can use MyChart to ask questions about today's visit, request a non-urgent call back, or ask for a work or school excuse for 24 hours   related to this e-Visit. If it has been greater than 24 hours you will need to follow up with your provider, or enter a new e-Visit to address those concerns.   You will get an email in the next two days asking about your experience. I hope that your e-visit has been valuable and will speed your recovery Thank you for choosing an e-visit.

## 2021-01-30 NOTE — Progress Notes (Signed)
Bone density down some from last in 2018 on radius t 3.0  you should consider  medication intervention as well as  resistance exercise and vit d  . Can make appt to discuss   with me  or endocrine specialist if  you wish

## 2021-02-03 ENCOUNTER — Encounter: Payer: Self-pay | Admitting: Internal Medicine

## 2021-02-03 ENCOUNTER — Telehealth (INDEPENDENT_AMBULATORY_CARE_PROVIDER_SITE_OTHER): Payer: Medicare Other | Admitting: Internal Medicine

## 2021-02-03 VITALS — Ht 63.5 in

## 2021-02-03 DIAGNOSIS — Z96649 Presence of unspecified artificial hip joint: Secondary | ICD-10-CM

## 2021-02-03 DIAGNOSIS — M810 Age-related osteoporosis without current pathological fracture: Secondary | ICD-10-CM

## 2021-02-03 MED ORDER — ALENDRONATE SODIUM 70 MG PO TABS: 70.0000 mg | ORAL_TABLET | ORAL | 3 refills | Status: DC

## 2021-02-03 NOTE — Progress Notes (Signed)
Virtual Visit via Video Note  I connected withNAME@ on 02/03/21 at 10:00 AM EDT by a video enabled telemedicine application and verified that I am speaking with the correct person using two identifiers. Location patient: home Location provider:[place] office Persons participating in the virtual visit: patient, provider  [place] national recommendations  regarding COVID 19 pandemic   video visit is advised over in office visit for this patient.  Patient aware  of the limitations of evaluation and management by telemedicine and  availability of in person appointments. and agreed to proceed.   HPI: Katherine Rodriguez presents for video visit to review bone density. She has had no history of fragility fracture does regular exercise bilateral hip replacements 2000 units of vitamin D a day calcium supplements tend to give her constipation but she is not dairy free. She has begun going to the gym with upper body weights. Family history Mom hip fracture around age 44 Father had hip fracture after a fall about age 51.     ROS: See pertinent positives and negatives per HPI.  Past Medical History:  Diagnosis Date   Abnormal Pap smear of anus    Anemia 2010   hx of   Arthritis    Basal cell carcinoma    leg 2020   Endometriosis    GERD (gastroesophageal reflux disease) 2012   hx of, none recent   History of blood transfusion 2010   no reaction   History of herpes labialis 40/98/1191   Periumbilical abdominal pain 03/11/2013   doubt uti but cx pending consdier small umbi hernia . refer  options of eval discussed     Pneumonia 1994   hx of   PPD positive    Recurrent HSV (herpes simplex virus)    S/P hip replacement 08/01/2011    Past Surgical History:  Procedure Laterality Date   JOINT REPLACEMENT Right 2010   hip   LAPAROTOMY  1980's, 1991   exploratory, laser for endometriosis   THYROIDECTOMY, PARTIAL  1973   TOTAL HIP ARTHROPLASTY Left 11/05/2013   Procedure: LEFT TOTAL HIP  ARTHROPLASTY ANTERIOR APPROACH;  Surgeon: Gearlean Alf, MD;  Location: [facility];  Service: Orthopedics;  Laterality: Left;    Family History  Problem Relation Age of Onset   Hypertension Mother    Arthritis Mother        spinal stenosis   Lymphoma Other    Other Other        bowel disease   Autoimmune disease Sister        cold agglutinin hemolytic anemia presumed autoimmune    Breast cancer Neg Hx     Social History   Tobacco Use   Smoking status: Former    Years: 15.00    Pack years: 0.00    Types: Cigarettes    Quit date: 08/21/1986    Years since quitting: 34.4   Smokeless tobacco: Never  Vaping Use   Vaping Use: Never used  Substance Use Topics   Alcohol use: Yes    Comment: rare   Drug use: No      Current Outpatient Medications:    alendronate (FOSAMAX) 70 MG tablet, Take 1 tablet (70 mg total) by mouth every 7 (seven) days., Disp: 12 tablet, Rfl: 3   Cholecalciferol (VITAMIN D3) 5000 UNITS CAPS, Take by mouth. 1 CAPSULE THREE TIMES WEEKLY, Disp: , Rfl:    loratadine (CLARITIN) 10 MG tablet, Take 10 mg daily by mouth., Disp: , Rfl:    methocarbamol (  ROBAXIN) 500 MG tablet, Take 1-2 tablets (500-1,000 mg total) by mouth every 8 (eight) hours as needed for muscle spasms., Disp: 40 tablet, Rfl: 0   Multiple Vitamins-Minerals (ALGAE BASED CALCIUM PO), Take by mouth., Disp: , Rfl:    MYRBETRIQ 50 MG TB24 tablet, Take 50 mg by mouth daily., Disp: , Rfl:    naproxen sodium (ALEVE) 220 MG tablet, , Disp: , Rfl:    polyethylene glycol powder (GLYCOLAX/MIRALAX) 17 GM/SCOOP powder, , Disp: , Rfl:    predniSONE (DELTASONE) 10 MG tablet, Take 4 tablets (40 mg total) by mouth daily with breakfast for 4 days, THEN 3 tablets (30 mg total) daily with breakfast for 4 days, THEN 2 tablets (20 mg total) daily with breakfast for 3 days, THEN 1 tablet (10 mg total) daily with breakfast for 3 days., Disp: 37 tablet, Rfl: 0   valACYclovir (VALTREX) 1000 MG tablet, TAKE 2 TABLETS BY MOUTH  TWICE A DAY AS NEEDED FOR FEVER BLISTERS, Disp: 30 tablet, Rfl: 5   vitamin C (ASCORBIC ACID) 500 MG tablet, Take 500 mg three times weekly, Disp: , Rfl:   EXAM: BP Readings from Last 3 Encounters:  08/23/20 118/74  01/12/20 120/72  09/03/18 114/60    VITALS per patient if applicable:  GENERAL: alert, oriented, appears well and in no acute distress  HEENT: atraumatic, conjunttiva clear, no obvious abnormalities on inspection of external nose and ears  NECK: normal movements of the head and neck  LUNGS: on inspection no signs of respiratory distress, breathing rate appears normal, no obvious gross SOB, gasping or wheezing  PSYCH/NEURO: pleasant and cooperative, no obvious depression or anxiety, speech and thought processing grossly intact   Sex: Female Measured: 12/22/2020 Weight: 149.0 lbs. Indications: Bilateral hip replacement, Caucasian, Estrogen Deficient, Family Hist. (Parent hip fracture), Left hip replaced, Postmenopausal, Right hip replacement Fractures: None Treatments: Vitamin D (E933.5)   ASSESSMENT: The BMD measured at Forearm Radius 33% is 0.623 g/cm2 with a T-score of -3.0. This patient is considered osteoporotic according to [facility]) criteria.   The quality of the exam is good. Bilatersl hips excluded due to surgical hardware. L4 excluded due to degenerative change.   Site Region Measured Date Measured Age YA BMD Significant CHANGE T-score Left Forearm Radius 33% 12/22/2020 66.4 -3.0 0.623 g/cm2 Left Forearm Radius 33% 06/06/2017 62.8 -2.8 0.642 g/cm2   AP Spine L1-L4 10/22/2020 66.4 -2.1 0.916 g/cm2 AP Spine L1-L4 06/06/2017 62.8 -2.2 0.919 g/cm2    ASSESSMENT AND PLAN:  Discussed the following assessment and plan:    ICD-10-CM   1. Osteoporosis without current pathological fracture, unspecified osteoporosis type  M81.0     2. Status post hip replacement, unspecified laterality  Z61.649      Long with family member  bilateral both parents with history of hip fracture also -3 is on the distal radius would advise trying bisphosphonate bone building therapy for up to 5 years and then go from there. Other options discussed continue adequate vitamin D calcium in diet weightbearing exercise lower extremity and upper extremities. We can follow-up at her next physical Bone density follow-up can be in 1 to 2 years depending. Counseled.   Expectant management and discussion of plan and treatment with opportunity to ask questions and all were answered. The patient agreed with the plan and demonstrated an understanding of the instructions.  25 minutes review and  evaluate and counsel  Advised to call back or seek an in-person evaluation if worsening  or having  further concerns . Return for when planned yearly check or as indicated .   Shanon Ace, MD

## 2021-02-10 ENCOUNTER — Ambulatory Visit (INDEPENDENT_AMBULATORY_CARE_PROVIDER_SITE_OTHER): Payer: Medicare Other

## 2021-02-10 ENCOUNTER — Other Ambulatory Visit: Payer: Self-pay

## 2021-02-10 VITALS — BP 110/62 | HR 67 | Temp 98.5°F | Ht 64.0 in | Wt 147.0 lb

## 2021-02-10 DIAGNOSIS — Z0000 Encounter for general adult medical examination without abnormal findings: Secondary | ICD-10-CM

## 2021-02-10 NOTE — Patient Instructions (Signed)
Katherine Rodriguez , Thank you for taking time to come for your Medicare Wellness Visit. I appreciate your ongoing commitment to your health goals. Please review the following plan we discussed and let me know if I can assist you in the future.   Screening recommendations/referrals: Colonoscopy: 10/16/2011  due 2023 Mammogram: due 09/10/2021 Bone Density: current 12/22/2020 Recommended yearly ophthalmology/optometry visit for glaucoma screening and checkup Recommended yearly dental visit for hygiene and checkup  Vaccinations: Influenza vaccine: due in the fall 2022 Pneumococcal vaccine: due 2 nd dose  Tdap vaccine: due will obtain local pharmacy  Shingles vaccine: due 2nd dose   Advanced directives: will provide copies   Conditions/risks identified: none   Next appointment: none    Preventive Care 10 Years and Older, Female Preventive care refers to lifestyle choices and visits with your health care provider that can promote health and wellness. What does preventive care include? A yearly physical exam. This is also called an annual well check. Dental exams once or twice a year. Routine eye exams. Ask your health care provider how often you should have your eyes checked. Personal lifestyle choices, including: Daily care of your teeth and gums. Regular physical activity. Eating a healthy diet. Avoiding tobacco and drug use. Limiting alcohol use. Practicing safe sex. Taking low-dose aspirin every day. Taking vitamin and mineral supplements as recommended by your health care provider. What happens during an annual well check? The services and screenings done by your health care provider during your annual well check will depend on your age, overall health, lifestyle risk factors, and family history of disease. Counseling  Your health care provider may ask you questions about your: Alcohol use. Tobacco use. Drug use. Emotional well-being. Home and relationship well-being. Sexual  activity. Eating habits. History of falls. Memory and ability to understand (cognition). Work and work Statistician. Reproductive health. Screening  You may have the following tests or measurements: Height, weight, and BMI. Blood pressure. Lipid and cholesterol levels. These may be checked every 5 years, or more frequently if you are over 61 years old. Skin check. Lung cancer screening. You may have this screening every year starting at age 84 if you have a 30-pack-year history of smoking and currently smoke or have quit within the past 15 years. Fecal occult blood test (FOBT) of the stool. You may have this test every year starting at age 61. Flexible sigmoidoscopy or colonoscopy. You may have a sigmoidoscopy every 5 years or a colonoscopy every 10 years starting at age 69. Hepatitis C blood test. Hepatitis B blood test. Sexually transmitted disease (STD) testing. Diabetes screening. This is done by checking your blood sugar (glucose) after you have not eaten for a while (fasting). You may have this done every 1-3 years. Bone density scan. This is done to screen for osteoporosis. You may have this done starting at age 54. Mammogram. This may be done every 1-2 years. Talk to your health care provider about how often you should have regular mammograms. Talk with your health care provider about your test results, treatment options, and if necessary, the need for more tests. Vaccines  Your health care provider may recommend certain vaccines, such as: Influenza vaccine. This is recommended every year. Tetanus, diphtheria, and acellular pertussis (Tdap, Td) vaccine. You may need a Td booster every 10 years. Zoster vaccine. You may need this after age 4. Pneumococcal 13-valent conjugate (PCV13) vaccine. One dose is recommended after age 52. Pneumococcal polysaccharide (PPSV23) vaccine. One dose is recommended after age  37. Talk to your health care provider about which screenings and vaccines  you need and how often you need them. This information is not intended to replace advice given to you by your health care provider. Make sure you discuss any questions you have with your health care provider. Document Released: 09/03/2015 Document Revised: 04/26/2016 Document Reviewed: 06/08/2015 Elsevier Interactive Patient Education  2017 [employer] [employer] Prevention in the Home Falls can cause injuries. They can happen to people of all ages. There are many things you can do to make your home safe and to help prevent falls. What can I do on the outside of my home? Regularly fix the edges of walkways and driveways and fix any cracks. Remove anything that might make you trip as you walk through a door, such as a raised step or threshold. Trim any bushes or trees on the path to your home. Use bright outdoor lighting. Clear any walking paths of anything that might make someone trip, such as rocks or tools. Regularly check to see if handrails are loose or broken. Make sure that both sides of any steps have handrails. Any raised decks and porches should have guardrails on the edges. Have any leaves, snow, or ice cleared regularly. Use sand or salt on walking paths during winter. Clean up any spills in your garage right away. This includes oil or grease spills. What can I do in the bathroom? Use night lights. Install grab bars by the toilet and in the tub and shower. Do not use towel bars as grab bars. Use non-skid mats or decals in the tub or shower. If you need to sit down in the shower, use a plastic, non-slip stool. Keep the floor dry. Clean up any water that spills on the floor as soon as it happens. Remove soap buildup in the tub or shower regularly. Attach bath mats securely with double-sided non-slip rug tape. Do not have throw rugs and other things on the floor that can make you trip. What can I do in the bedroom? Use night lights. Make sure that you have a light by your bed that  is easy to reach. Do not use any sheets or blankets that are too big for your bed. They should not hang down onto the floor. Have a firm chair that has side arms. You can use this for support while you get dressed. Do not have throw rugs and other things on the floor that can make you trip. What can I do in the kitchen? Clean up any spills right away. Avoid walking on wet floors. Keep items that you use a lot in easy-to-reach places. If you need to reach something above you, use a strong step stool that has a grab bar. Keep electrical cords out of the way. Do not use floor polish or wax that makes floors slippery. If you must use wax, use non-skid floor wax. Do not have throw rugs and other things on the floor that can make you trip. What can I do with my stairs? Do not leave any items on the stairs. Make sure that there are handrails on both sides of the stairs and use them. Fix handrails that are broken or loose. Make sure that handrails are as long as the stairways. Check any carpeting to make sure that it is firmly attached to the stairs. Fix any carpet that is loose or worn. Avoid having throw rugs at the top or bottom of the stairs. If you do have  throw rugs, attach them to the floor with carpet tape. Make sure that you have a light switch at the top of the stairs and the bottom of the stairs. If you do not have them, ask someone to add them for you. What else can I do to help prevent falls? Wear shoes that: Do not have high heels. Have rubber bottoms. Are comfortable and fit you well. Are closed at the toe. Do not wear sandals. If you use a stepladder: Make sure that it is fully opened. Do not climb a closed stepladder. Make sure that both sides of the stepladder are locked into place. Ask someone to hold it for you, if possible. Clearly mark and make sure that you can see: Any grab bars or handrails. First and last steps. Where the edge of each step is. Use tools that help you  move around (mobility aids) if they are needed. These include: Canes. Walkers. Scooters. Crutches. Turn on the lights when you go into a dark area. Replace any light bulbs as soon as they burn out. Set up your furniture so you have a clear path. Avoid moving your furniture around. If any of your floors are uneven, fix them. If there are any pets around you, be aware of where they are. Review your medicines with your doctor. Some medicines can make you feel dizzy. This can increase your chance of falling. Ask your doctor what other things that you can do to help prevent falls. This information is not intended to replace advice given to you by your health care provider. Make sure you discuss any questions you have with your health care provider. Document Released: 06/03/2009 Document Revised: 01/13/2016 Document Reviewed: 09/11/2014 Elsevier Interactive Patient Education  2017 [employer].

## 2021-02-10 NOTE — Progress Notes (Signed)
Subjective:   Katherine Rodriguez is a 65 y.o. female who presents for an Initial Medicare Annual Wellness Visit.  Review of Systems    N/a       Objective:    There were no vitals filed for this visit. There is no height or weight on file to calculate BMI.  Advanced Directives 11/05/2013 10/28/2013  Does Patient Have a Medical Advance Directive? Patient has advance directive, copy not in chart Patient has advance directive, copy not in chart  Type of Advance Directive [place];Living will [place];Living will  Does patient want to make changes to medical advance directive? No change requested No change requested  Copy of [place] in Chart? - Copy requested from family  Pre-existing out of facility DNR order (yellow form or pink MOST form) - No    Current Medications (verified) Outpatient Encounter Medications as of 02/10/2021  Medication Sig   alendronate (FOSAMAX) 70 MG tablet Take 1 tablet (70 mg total) by mouth every 7 (seven) days.   Cholecalciferol (VITAMIN D3) 5000 UNITS CAPS Take by mouth. 1 CAPSULE THREE TIMES WEEKLY   loratadine (CLARITIN) 10 MG tablet Take 10 mg daily by mouth.   methocarbamol (ROBAXIN) 500 MG tablet Take 1-2 tablets (500-1,000 mg total) by mouth every 8 (eight) hours as needed for muscle spasms.   Multiple Vitamins-Minerals (ALGAE BASED CALCIUM PO) Take by mouth.   MYRBETRIQ 50 MG TB24 tablet Take 50 mg by mouth daily.   naproxen sodium (ALEVE) 220 MG tablet    polyethylene glycol powder (GLYCOLAX/MIRALAX) 17 GM/SCOOP powder    valACYclovir (VALTREX) 1000 MG tablet TAKE 2 TABLETS BY MOUTH TWICE A DAY AS NEEDED FOR FEVER BLISTERS   vitamin C (ASCORBIC ACID) 500 MG tablet Take 500 mg three times weekly   No facility-administered encounter medications on file as of 02/10/2021.    Allergies (verified) Patient has no known allergies.   History: Past Medical History:  Diagnosis Date   Abnormal Pap  smear of anus    Anemia 2010   hx of   Arthritis    Basal cell carcinoma    leg 2020   Endometriosis    GERD (gastroesophageal reflux disease) 2012   hx of, none recent   History of blood transfusion 2010   no reaction   History of herpes labialis 90/30/0923   Periumbilical abdominal pain 03/11/2013   doubt uti but cx pending consdier small umbi hernia . refer  options of eval discussed     Pneumonia 1994   hx of   PPD positive    Recurrent HSV (herpes simplex virus)    S/P hip replacement 08/01/2011   Past Surgical History:  Procedure Laterality Date   JOINT REPLACEMENT Right 2010   hip   LAPAROTOMY  1980's, 1991   exploratory, laser for endometriosis   THYROIDECTOMY, PARTIAL  1973   TOTAL HIP ARTHROPLASTY Left 11/05/2013   Procedure: LEFT TOTAL HIP ARTHROPLASTY ANTERIOR APPROACH;  Surgeon: Gearlean Alf, MD;  Location: [facility];  Service: Orthopedics;  Laterality: Left;   Family History  Problem Relation Age of Onset   Hypertension Mother    Arthritis Mother        spinal stenosis   Lymphoma Other    Other Other        bowel disease   Autoimmune disease Sister        cold agglutinin hemolytic anemia presumed autoimmune    Breast cancer Neg  Hx    Social History   Socioeconomic History   Marital status: Married    Spouse name: Not on file   Number of children: Not on file   Years of education: Not on file   Highest education level: Not on file  Occupational History   Not on file  Tobacco Use   Smoking status: Former    Years: 15.00    Pack years: 0.00    Types: Cigarettes    Quit date: 08/21/1986    Years since quitting: 34.4   Smokeless tobacco: Never  Vaping Use   Vaping Use: Never used  Substance and Sexual Activity   Alcohol use: Yes    Comment: rare   Drug use: No   Sexual activity: Yes  Other Topics Concern   Not on file  Social History Narrative   Married   Regular exercise- yes   Cutting back on caffeine   Sleep 7    [place] 3 pet dog and cat.     Works hospital nursing   G4 P1    Social Determinants of Radio broadcast assistant Strain: Not on file  Food Insecurity: Not on file  Transportation Needs: Not on file  Physical Activity: Not on file  Stress: Not on file  Social Connections: Not on file    Tobacco Counseling Counseling given: Not Answered   Clinical Intake:                 Diabetic?no         Activities of Daily Living No flowsheet data found.  Patient Care Team: Panosh, [name], MD as PCP - Dossie Arbour, MD (Inactive) (Gastroenterology) Avon Gully, NP as Nurse Practitioner (Obstetrics and Gynecology) Gaynelle Arabian, MD as Consulting Physician (Orthopedic Surgery) Sydnee Levans, MD as Referring Physician (Dermatology)  Indicate any recent Medical Services you may have received from other than [facility] providers in the past year (date may be approximate).     Assessment:   This is a routine wellness examination for Katherine Rodriguez.  Hearing/Vision screen No results found.  Dietary issues and exercise activities discussed:     Goals Addressed   None    Depression Screen PHQ 2/9 Scores 08/23/2020 09/03/2018  PHQ - 2 Score 0 0    Fall Risk Fall Risk  08/23/2020  Falls in the past year? 0  Number falls in past yr: 0  Injury with Fall? 0    FALL RISK PREVENTION PERTAINING TO THE HOME:  Any stairs in or around the home? Yes  If so, are there any without handrails? No  Home free of loose throw rugs in walkways, pet beds, electrical cords, etc? Yes  Adequate lighting in your home to reduce risk of falls? Yes   ASSISTIVE DEVICES UTILIZED TO PREVENT FALLS:  Life alert? No  Use of a cane, walker or w/c? No  Grab bars in the bathroom? No  Shower chair or bench in shower? Yes  Elevated toilet seat or a handicapped toilet? Yes   TIMED UP AND GO:  Was the test performed? Yes .  Length of time to ambulate 10 feet: 7 sec.   Gait steady and fast without use of assistive  device  Cognitive Function:    Normal cognitive status assessed by direct observation by this Nurse Health Advisor. No abnormalities found.      Immunizations Immunization History  Administered Date(s) Administered   Influenza-Unspecified 05/03/2017, 05/25/2020   PFIZER(Purple Top)SARS-COV-2 Vaccination 08/16/2019, 09/05/2019  Pneumococcal Conjugate-13 08/23/2020   Td 08/21/1998, 05/11/2009   Zoster Recombinat (Shingrix) 09/03/2018   Zoster, Live 06/29/2015    TDAP status: Due, Education has been provided regarding the importance of this vaccine. Advised may receive this vaccine at local pharmacy or Health Dept. Aware to provide a copy of the vaccination record if obtained from local pharmacy or Health Dept. Verbalized acceptance and understanding.  Flu Vaccine status: Up to date  Pneumococcal vaccine status: Up to date  Covid-19 vaccine status: Completed vaccines  Qualifies for Shingles Vaccine? Yes   Zostavax completed No   Shingrix Completed?: No.    Education has been provided regarding the importance of this vaccine. Patient has been advised to call insurance company to determine out of pocket expense if they have not yet received this vaccine. Advised may also receive vaccine at local pharmacy or Health Dept. Verbalized acceptance and understanding.  Screening Tests Health Maintenance  Topic Date Due   Zoster Vaccines- Shingrix (2 of 2) 10/29/2018   TETANUS/TDAP  05/12/2019   COVID-19 Vaccine (3 - Pfizer risk series) 10/03/2019   INFLUENZA VACCINE  03/21/2021   PNA vac Low Risk Adult (2 of 2 - PPSV23) 08/23/2021   COLONOSCOPY (Pts 45-50yrs Insurance coverage will need to be confirmed)  10/15/2021   MAMMOGRAM  09/10/2022   DEXA SCAN  Completed   Hepatitis C Screening  Completed   HPV VACCINES  Aged Out    Health Maintenance  Health Maintenance Due  Topic Date Due   Zoster Vaccines- Shingrix (2 of 2) 10/29/2018   TETANUS/TDAP  05/12/2019   COVID-19 Vaccine (3 -  Pfizer risk series) 10/03/2019    Colorectal cancer screening: Type of screening: Colonoscopy. Completed 10/16/2011. Repeat every 10 years  Mammogram status: Completed 09/10/2020. Repeat every year  Bone Density status: Completed 12/22/2020. Results reflect: Bone density results: OSTEOPOROSIS. Repeat every 2 years.  Lung Cancer Screening: (Low Dose CT Chest recommended if Age 63-80 years, 30 pack-year currently smoking OR have quit w/in 15years.) does not qualify.   Lung Cancer Screening Referral: n/a  Additional Screening:  Hepatitis C Screening: does not qualify; Completed 09/03/2018  Vision Screening: Recommended annual ophthalmology exams for early detection of glaucoma and other disorders of the eye. Is the patient up to date with their annual eye exam?  Yes  Who is the provider or what is the name of the office in which the patient attends annual eye exams? Dr.Lyles If pt is not established with a provider, would they like to be referred to a provider to establish care? No .   Dental Screening: Recommended annual dental exams for proper oral hygiene  Community Resource Referral / Chronic Care Management: CRR required this visit?  No   CCM required this visit?  No      Plan:     I have personally reviewed and noted the following in the patient's chart:   Medical and social history Use of alcohol, tobacco or illicit drugs  Current medications and supplements including opioid prescriptions. Patient is not currently taking opioid prescriptions. Functional ability and status Nutritional status Physical activity Advanced directives List of other physicians Hospitalizations, surgeries, and ER visits in previous 12 months Vitals Screenings to include cognitive, depression, and falls Referrals and appointments  In addition, I have reviewed and discussed with patient certain preventive protocols, quality metrics, and best practice recommendations. A written personalized  care plan for preventive services as well as general preventive health recommendations were provided to patient.  Randel Pigg, LPN   0/[id number]/5537   Nurse Notes: none

## 2021-03-02 ENCOUNTER — Other Ambulatory Visit (HOSPITAL_BASED_OUTPATIENT_CLINIC_OR_DEPARTMENT_OTHER): Payer: Self-pay

## 2021-03-02 MED ORDER — TETANUS-DIPHTH-ACELL PERTUSSIS 5-2.5-18.5 LF-MCG/0.5 IM SUSY
PREFILLED_SYRINGE | INTRAMUSCULAR | 0 refills | Status: DC
Start: 2021-03-02 — End: 2023-12-25
  Filled 2021-03-02: qty 0.5, 1d supply, fill #0

## 2021-03-23 ENCOUNTER — Other Ambulatory Visit (HOSPITAL_COMMUNITY): Payer: Self-pay

## 2021-05-14 ENCOUNTER — Telehealth: Payer: Medicare Other | Admitting: Nurse Practitioner

## 2021-05-14 DIAGNOSIS — J0140 Acute pansinusitis, unspecified: Secondary | ICD-10-CM | POA: Diagnosis not present

## 2021-05-14 DIAGNOSIS — H1033 Unspecified acute conjunctivitis, bilateral: Secondary | ICD-10-CM | POA: Diagnosis not present

## 2021-05-14 MED ORDER — POLYMYXIN B-TRIMETHOPRIM 10000-0.1 UNIT/ML-% OP SOLN: 1.0000 [drp] | Freq: Four times a day (QID) | OPHTHALMIC | 0 refills | Status: AC

## 2021-05-14 MED ORDER — AMOXICILLIN-POT CLAVULANATE 875-125 MG PO TABS: 1.0000 | ORAL_TABLET | Freq: Two times a day (BID) | ORAL | 0 refills | Status: AC

## 2021-05-14 NOTE — Progress Notes (Signed)
E-Visit for Sinus Problems  We are sorry that you are not feeling well.  Here is how we plan to help!  Based on what you have shared with me it looks like you have sinusitis.  Sinusitis is inflammation and infection in the sinus cavities of the head.  Based on your presentation I believe you most likely have Acute Bacterial Sinusitis.  This is an infection caused by bacteria and is treated with antibiotics. I have prescribed Augmentin 875mg /125mg  one tablet twice daily with food, for 7 days. You may use an oral decongestant such as Mucinex D or if you have glaucoma or high blood pressure use plain Mucinex. Saline nasal spray help and can safely be used as often as needed for congestion.  If you develop worsening sinus pain, fever or notice severe headache and vision changes, or if symptoms are not better after completion of antibiotic, please schedule an appointment with a health care provider.    Based on what you have shared with me it looks like you have conjunctivitis.  Conjunctivitis is a common inflammatory or infectious condition of the eye that is often referred to as "pink eye".    I have prescribed Polytrim Ophthalmic drops 1-2 drops 4 times a day times 5 days   Home Care: Do not wear your contacts until you complete your treatment plan. Avoid sharing towels, bed linen, personal items with a person who has pink eye. See attention for anyone in your home with similar symptoms. Only take medications as instructed by your medical team. Complete the entire course of an antibiotic. Do not take these medications with alcohol. A steam or ultrasonic humidifier can help congestion.  You can place a towel over your head and breathe in the steam from hot water coming from a faucet. Avoid close contacts especially the very young and the elderly. Cover your mouth when you cough or sneeze. Always remember to wash your hands.  Get Help Right Away If: Your symptoms do not improve. You develop  blurred or loss of vision. Your symptoms worsen (increased discharge, pain or redness) You develop worsening fever or sinus pain. You develop a severe head ache or visual changes. Your symptoms persist after you have completed your treatment plan.  Make sure you Understand these instructions. Will watch your condition. Will get help right away if you are not doing well or get worse.  Thank you for choosing an e-visit.  Your e-visit answers were reviewed by a board certified advanced clinical practitioner to complete your personal care plan. Depending upon the condition, your plan could have included both over the counter or prescription medications.  Please review your pharmacy choice. Make sure the pharmacy is open so you can pick up prescription now. If there is a problem, you may contact your provider through [employer] and have the prescription routed to another pharmacy.  Your safety is important to [place]. If you have drug allergies check your prescription carefully.   For the next 24 hours you can use MyChart to ask questions about today's visit, request a non-urgent call back, or ask for a work or school excuse. You will get an email in the next two days asking about your experience. I hope that your e-visit has been valuable and will speed your recovery.   I spent approximately 10 minutes reviewing the patient's history, current symptoms and coordinating their care today.    Meds ordered this encounter  Medications   amoxicillin-clavulanate (AUGMENTIN) 875-125 MG tablet  Sig: Take 1 tablet by mouth 2 (two) times daily for 7 days. Take with food    Dispense:  14 tablet    Refill:  0   trimethoprim-polymyxin b (POLYTRIM) ophthalmic solution    Sig: Place 1 drop into both eyes in the morning, at noon, in the evening, and at bedtime for 5 days.    Dispense:  10 mL    Refill:  0

## 2021-06-14 ENCOUNTER — Other Ambulatory Visit: Payer: Self-pay | Admitting: Internal Medicine

## 2021-07-07 ENCOUNTER — Other Ambulatory Visit (HOSPITAL_COMMUNITY): Payer: Self-pay

## 2021-08-25 IMAGING — MG MM DIGITAL SCREENING BILAT W/ TOMO AND CAD
8 series · 9 of 24 positions shown · non-contrast
Comparison: Previous exam(s).

CLINICAL DATA: Screening.

EXAM:
DIGITAL SCREENING BILATERAL MAMMOGRAM WITH TOMO AND CAD

[L MLO synth-2D]
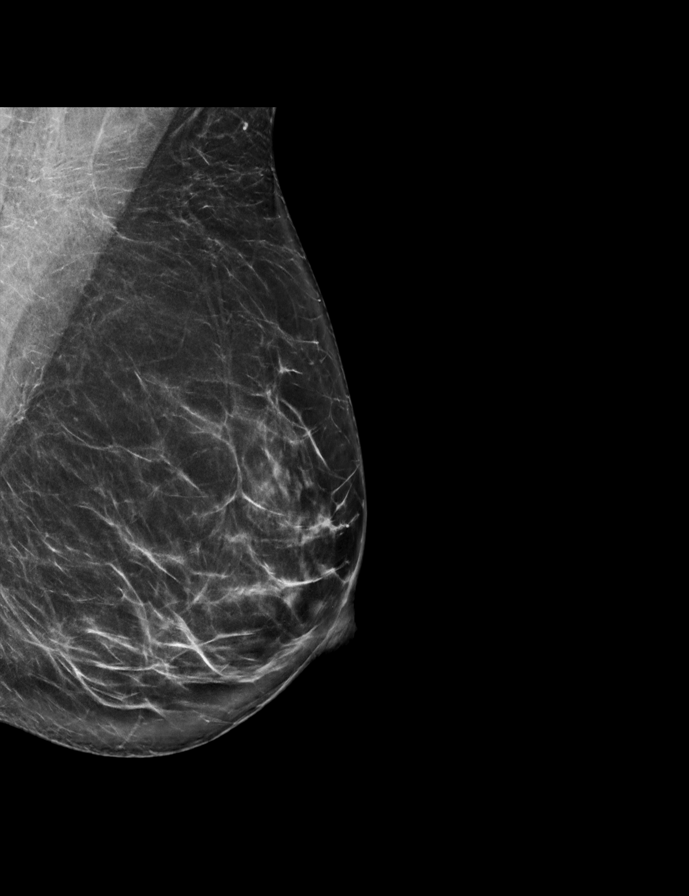

[R CC synth-2D]
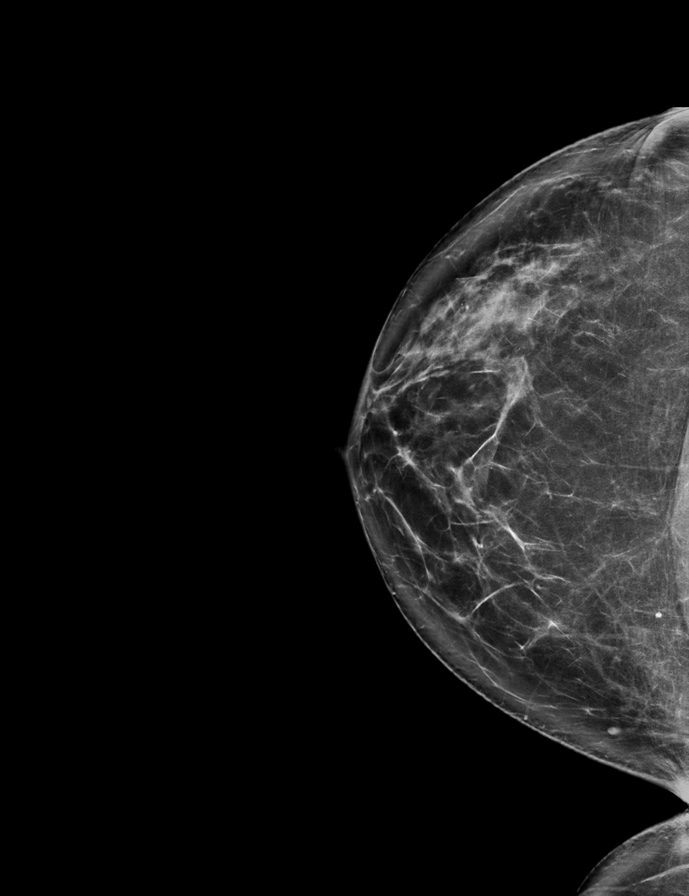

[R MLO synth-2D]
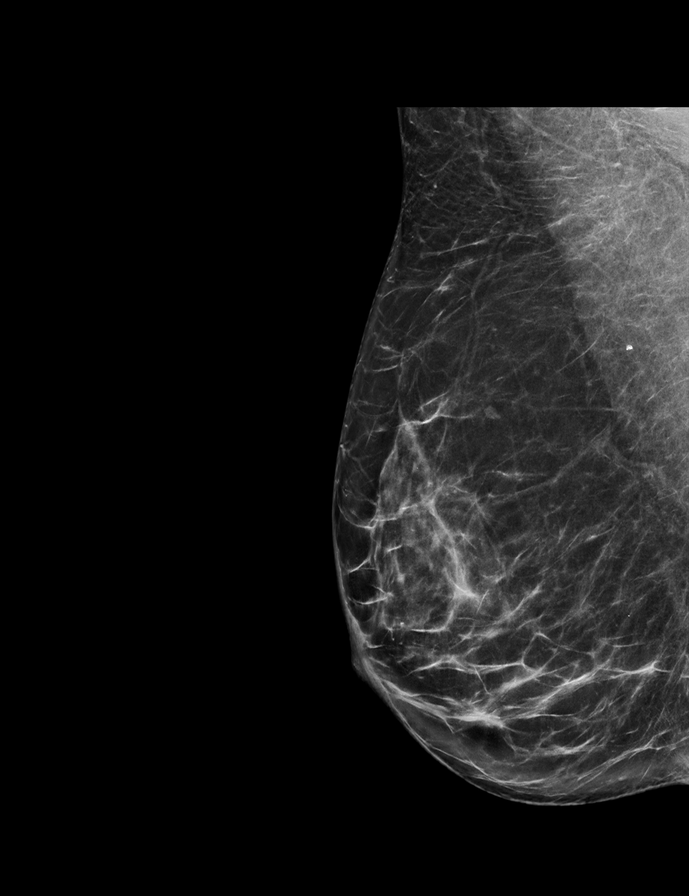

[L CC synth-2D]
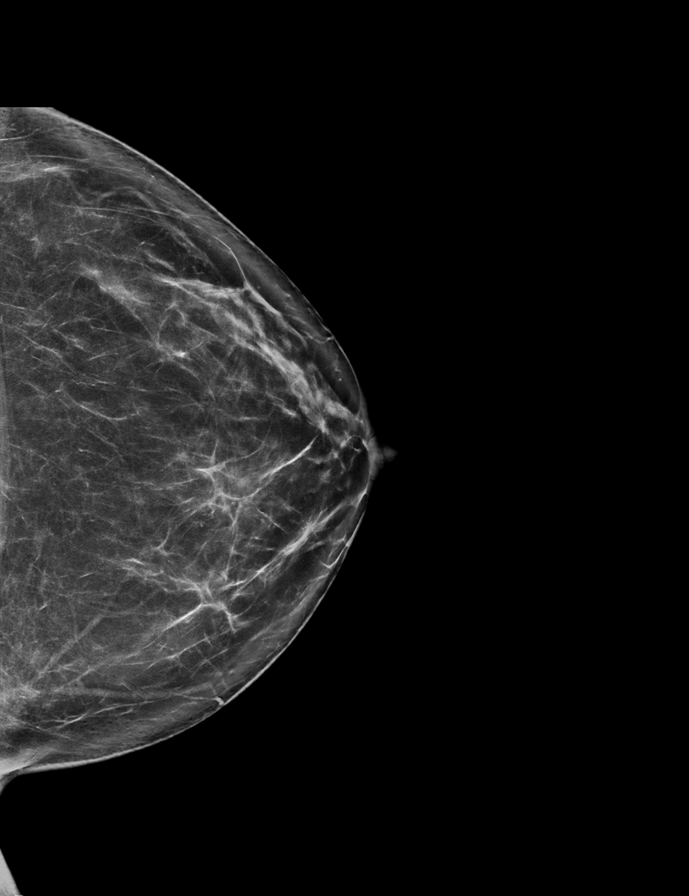

[L CC tomo · 2 of 73 frames shown]
[frame 24/73]
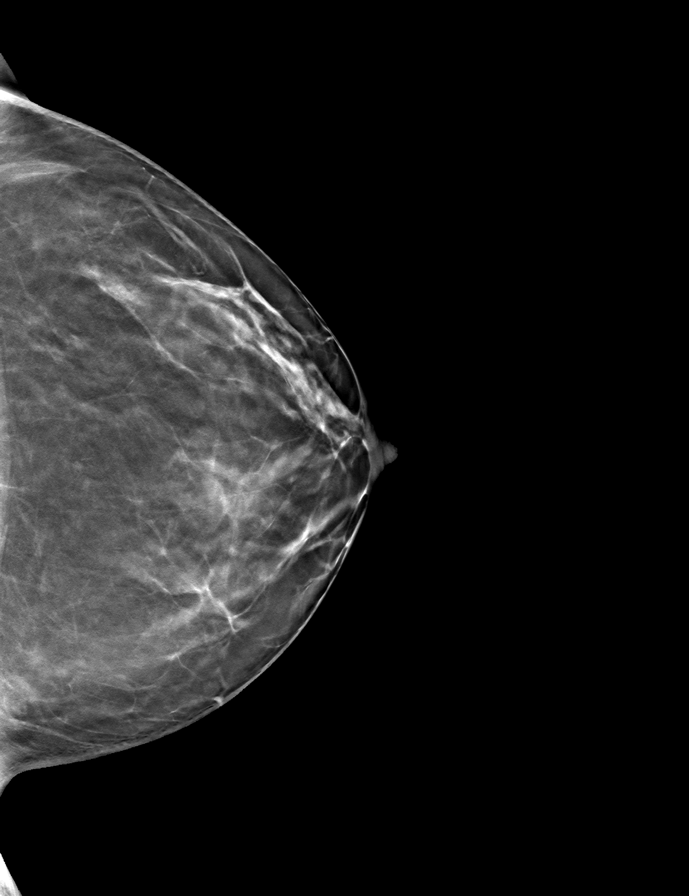
[frame 37/73]
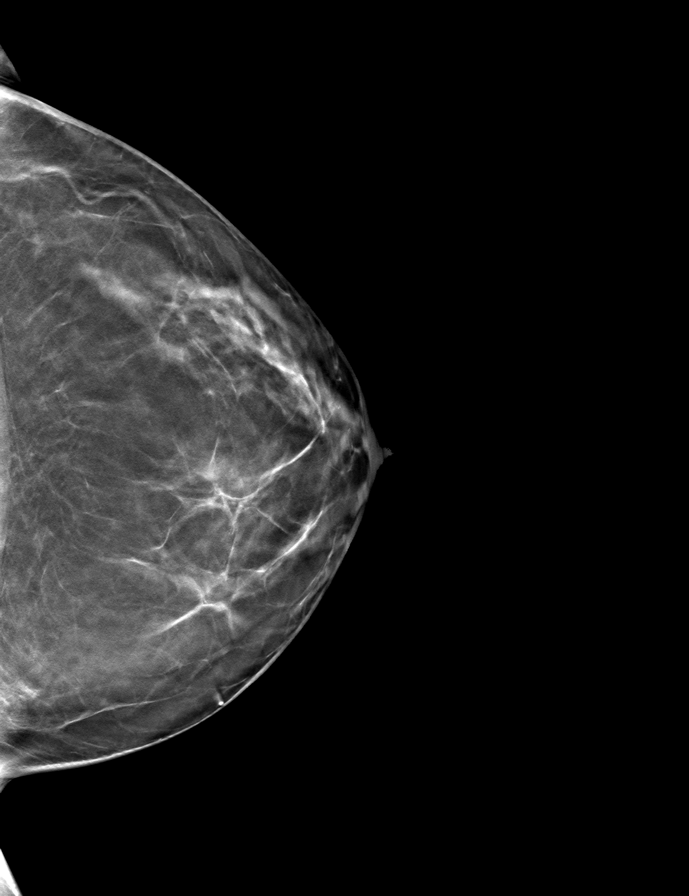

[R CC tomo · tomo slice 39/76.0]
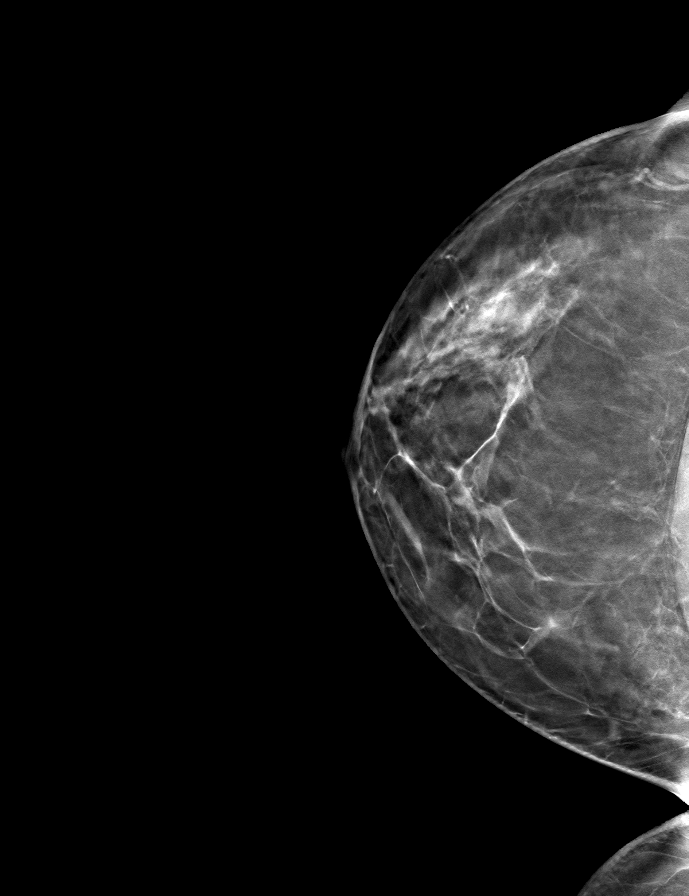

[L MLO tomo · tomo slice 38/75.0]
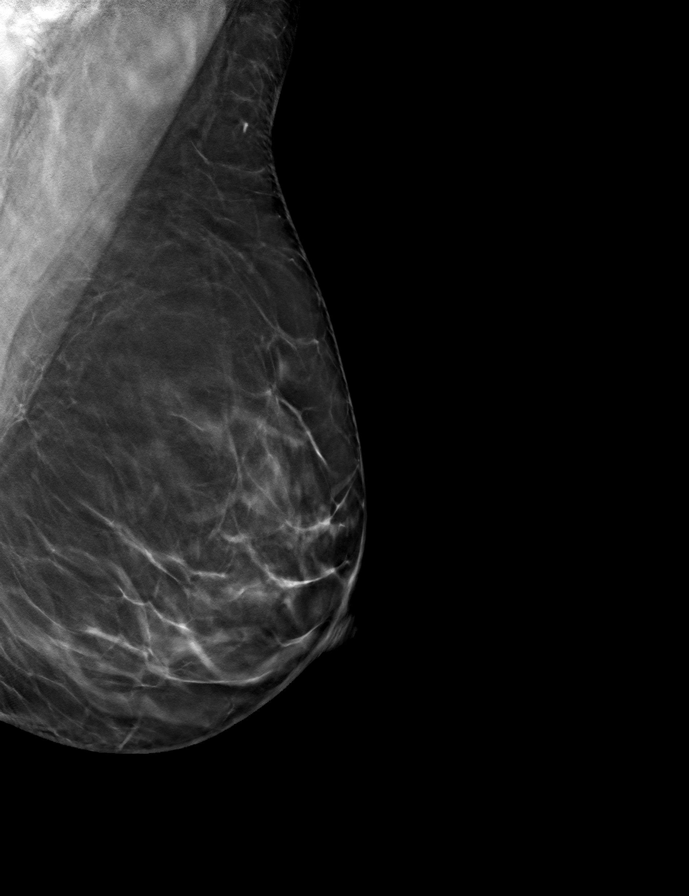

[R MLO tomo · tomo slice 37/73.0]
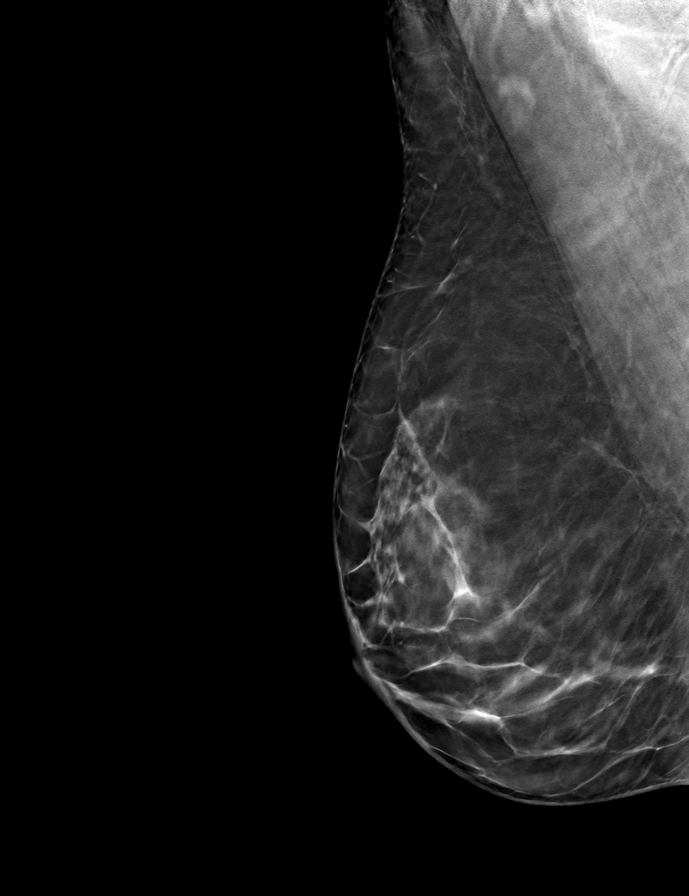

[9 of 24 positions shown; findings below may reference images not displayed]

ACR Breast Density Category b: There are scattered areas of
fibroglandular density.
FINDINGS: There are no findings suspicious for malignancy. The images were
evaluated with computer-aided detection.
IMPRESSION: No mammographic evidence of malignancy. A result letter of this
screening mammogram will be mailed directly to the patient.

RECOMMENDATION:
Screening mammogram in one year. (Code:ZP-7-VX7)

BI-RADS CATEGORY  1: Negative.

## 2021-08-29 ENCOUNTER — Other Ambulatory Visit: Payer: Self-pay | Admitting: Internal Medicine

## 2021-08-29 DIAGNOSIS — Z1231 Encounter for screening mammogram for malignant neoplasm of breast: Secondary | ICD-10-CM

## 2021-09-20 ENCOUNTER — Ambulatory Visit: Payer: Medicare Other

## 2021-09-26 ENCOUNTER — Ambulatory Visit
Admission: RE | Admit: 2021-09-26 | Discharge: 2021-09-26 | Disposition: A | Discharge status: Home / Self Care | Payer: Medicare Other | Source: Ambulatory Visit | Attending: Internal Medicine | Admitting: Internal Medicine

## 2021-09-26 DIAGNOSIS — Z1231 Encounter for screening mammogram for malignant neoplasm of breast: Secondary | ICD-10-CM

## 2021-11-16 ENCOUNTER — Other Ambulatory Visit (HOSPITAL_BASED_OUTPATIENT_CLINIC_OR_DEPARTMENT_OTHER): Payer: Self-pay

## 2021-11-16 MED ORDER — ZOSTER VAC RECOMB ADJUVANTED 50 MCG/0.5ML IM SUSR
INTRAMUSCULAR | 0 refills | Status: DC
Start: 2021-11-16 — End: 2023-12-25
  Filled 2021-11-16: qty 0.5, 1d supply, fill #0

## 2021-11-17 ENCOUNTER — Other Ambulatory Visit (HOSPITAL_BASED_OUTPATIENT_CLINIC_OR_DEPARTMENT_OTHER): Payer: Self-pay

## 2022-02-06 ENCOUNTER — Other Ambulatory Visit (HOSPITAL_COMMUNITY): Payer: Self-pay

## 2022-02-07 ENCOUNTER — Other Ambulatory Visit: Payer: Self-pay | Admitting: Internal Medicine

## 2022-02-09 ENCOUNTER — Telehealth: Payer: Self-pay | Admitting: Internal Medicine

## 2022-02-09 NOTE — Telephone Encounter (Signed)
Left message for patient to call back and schedule Medicare Annual Wellness Visit (AWV) either virtually or in office. Left  my jabber number [phone]   Last AWV ; 02/10/21 please schedule at anytime with [facility] Nurse Health Advisor 1 or 2     

## 2022-02-27 ENCOUNTER — Ambulatory Visit (INDEPENDENT_AMBULATORY_CARE_PROVIDER_SITE_OTHER): Payer: Medicare Other

## 2022-02-27 VITALS — Ht 64.25 in | Wt 145.0 lb

## 2022-02-27 DIAGNOSIS — Z0000 Encounter for general adult medical examination without abnormal findings: Secondary | ICD-10-CM

## 2022-02-27 NOTE — Progress Notes (Signed)
I connected with Katherine Rodriguez today by telephone and verified that I am speaking with the correct person using two identifiers. Location patient: home Location provider: work Persons participating in the virtual visit: [name], Glenna Durand LPN.   I discussed the limitations, risks, security and privacy concerns of performing an evaluation and management service by telephone and the availability of in person appointments. I also discussed with the patient that there may be a patient responsible charge related to this service. The patient expressed understanding and verbally consented to this telephonic visit.    Interactive audio and video telecommunications were attempted between this provider and patient, however failed, due to patient having technical difficulties OR patient did not have access to video capability.  We continued and completed visit with audio only.     Vital signs may be patient reported or missing.  Subjective:   Katherine Rodriguez is a 66 y.o. female who presents for Medicare Annual (Subsequent) preventive examination.  Review of Systems     Cardiac Risk Factors include: advanced age (>70mn, >>81women)     Objective:    Today's Vitals   02/27/22 1341  Weight: 145 lb (65.8 kg)  Height: 5' 4.25" (1.632 m)   Body mass index is 24.7 kg/m.     02/27/2022    1:49 PM 02/10/2021    9:20 AM 11/05/2013   12:30 PM 10/28/2013    8:41 AM  Advanced Directives  Does Patient Have a Medical Advance Directive? Yes Yes Patient has advance directive, copy not in chart Patient has advance directive, copy not in chart  Type of Advance Directive [place] will [place] will [place] will [place]Living will  Does patient want to make changes to medical advance directive?   No change requested No change requested  Copy of [place] Chart? No - copy requested  No - copy requested  Copy requested from family  Pre-existing out of facility DNR order (yellow form or pink MOST form)    No    Current Medications (verified) Outpatient Encounter Medications as of 02/27/2022  Medication Sig   alendronate (FOSAMAX) 70 MG tablet TAKE 1 TABLET BY MOUTH EVERY 7 DAYS.   Cholecalciferol (VITAMIN D3) 5000 UNITS CAPS Take by mouth. 1 CAPSULE THREE TIMES WEEKLY   loratadine (CLARITIN) 10 MG tablet Take 10 mg daily by mouth.   MYRBETRIQ 50 MG TB24 tablet Take 50 mg by mouth daily. Takes as needed   naproxen sodium (ALEVE) 220 MG tablet    polyethylene glycol powder (GLYCOLAX/MIRALAX) 17 GM/SCOOP powder    valACYclovir (VALTREX) 1000 MG tablet TAKE 2 TABLETS BY MOUTH TWICE A DAY AS NEEDED FOR FEVER BLISTERS   vitamin C (ASCORBIC ACID) 500 MG tablet Take 500 mg three times weekly   methocarbamol (ROBAXIN) 500 MG tablet Take 1-2 tablets (500-1,000 mg total) by mouth every 8 (eight) hours as needed for muscle spasms. (Patient not taking: Reported on 02/10/2021)   Multiple Vitamins-Minerals (ALGAE BASED CALCIUM PO) Take by mouth. (Patient not taking: Reported on 02/10/2021)   Tdap (BOOSTRIX) 5-2.5-18.5 LF-MCG/0.5 injection Inject into the muscle.   Zoster Vaccine Adjuvanted ([facility] injection Inject into the muscle.   No facility-administered encounter medications on file as of 02/27/2022.    Allergies (verified) Patient has no known allergies.   History: Past Medical History:  Diagnosis Date   Abnormal Pap smear of anus    Anemia 2010  hx of   Arthritis    Basal cell carcinoma    leg 2020   Endometriosis    GERD (gastroesophageal reflux disease) 2012   hx of, none recent   History of blood transfusion 2010   no reaction   History of herpes labialis 23/55/7322   Periumbilical abdominal pain 03/11/2013   doubt uti but cx pending consdier small umbi hernia . refer  options of eval discussed     Pneumonia 1994   hx of   PPD positive    Recurrent HSV  (herpes simplex virus)    S/P hip replacement 08/01/2011   Past Surgical History:  Procedure Laterality Date   JOINT REPLACEMENT Right 2010   hip   LAPAROTOMY  1980's, 1991   exploratory, laser for endometriosis   THYROIDECTOMY, PARTIAL  1973   TOTAL HIP ARTHROPLASTY Left 11/05/2013   Procedure: LEFT TOTAL HIP ARTHROPLASTY ANTERIOR APPROACH;  Surgeon: Gearlean Alf, MD;  Location: [facility];  Service: Orthopedics;  Laterality: Left;   Family History  Problem Relation Age of Onset   Hypertension Mother    Arthritis Mother        spinal stenosis   Lymphoma Other    Other Other        bowel disease   Autoimmune disease Sister        cold agglutinin hemolytic anemia presumed autoimmune    Breast cancer Neg Hx    Social History   Socioeconomic History   Marital status: Married    Spouse name: Not on file   Number of children: Not on file   Years of education: Not on file   Highest education level: Not on file  Occupational History   Not on file  Tobacco Use   Smoking status: Former    Years: 15.00    Types: Cigarettes    Quit date: 08/21/1986    Years since quitting: 35.5   Smokeless tobacco: Never  Vaping Use   Vaping Use: Never used  Substance and Sexual Activity   Alcohol use: Yes    Comment: rare   Drug use: No   Sexual activity: Yes  Other Topics Concern   Not on file  Social History Narrative   Married   Regular exercise- yes   Cutting back on caffeine   Sleep 7    [place] 3 pet dog and cat.    Works hospital nursing   G4 [place] Strain: [place]  (02/27/2022)   Overall Financial Resource Strain (CARDIA)    Difficulty of Paying Living Expenses: Not hard at all  Food Insecurity: No Food Insecurity (02/27/2022)   Hunger Vital Sign    Worried About Running Out of Food in the Last Year: Never true    [place] in the Last Year: Never true  Transportation Needs: No Transportation Needs (02/27/2022)   PRAPARE  - Hydrologist (Medical): No    Lack of Transportation (Non-Medical): No  Physical Activity: Sufficiently Active (02/27/2022)   Exercise Vital Sign    Days of Exercise per Week: 3 days    Minutes of Exercise per Session: 60 min  Stress: No Stress Concern Present (02/27/2022)   [place]    Feeling of Stress : Not at all  Social Connections: Moderately Isolated (02/10/2021)   Social Connection and Isolation Panel [NHANES]    Frequency of  Communication with Friends and Family: Three times a week    Frequency of Social Gatherings with Friends and Family: Three times a week    Attends Religious Services: Never    Active Member of Clubs or Organizations: No    Attends Archivist Meetings: Never    Marital Status: Married    Tobacco Counseling Counseling given: Not Answered   Clinical Intake:  Pre-visit preparation completed: Yes  Pain : No/denies pain     Nutritional Status: BMI of 19-24  Normal Nutritional Risks: None Diabetes: No  How often do you need to have someone help you when you read instructions, pamphlets, or other written materials from your doctor or pharmacy?: 1 - Never What is the last grade level you completed in school?: bachelors degree  Diabetic? no  Interpreter Needed?: No  Information entered by :: NAllen LPN   Activities of Daily Living    02/27/2022    1:50 PM  In your present state of health, do you have any difficulty performing the following activities:  Hearing? 0  Vision? 0  Difficulty concentrating or making decisions? 0  Walking or climbing stairs? 0  Dressing or bathing? 0  Doing errands, shopping? 0  Preparing Food and eating ? N  Using the Toilet? N  In the past six months, have you accidently leaked urine? N  Do you have problems with loss of bowel control? N  Managing your Medications? N  Managing your Finances? N   Housekeeping or managing your Housekeeping? N    Patient Care Team: Panosh, [name], MD as PCP - Dossie Arbour, MD (Inactive) (Gastroenterology) Avon Gully, NP as Nurse Practitioner (Obstetrics and Gynecology) Gaynelle Arabian, MD as Consulting Physician (Orthopedic Surgery) Sydnee Levans, MD as Referring Physician (Dermatology)  Indicate any recent Medical Services you may have received from other than [facility] providers in the past year (date may be approximate).     Assessment:   This is a routine wellness examination for Jasminemarie.  Hearing/Vision screen Vision Screening - Comments:: Regular eye exams, Dr. Prudencio Burly  Dietary issues and exercise activities discussed: Current Exercise Habits: Structured exercise class, Type of exercise: yoga;strength training/weights;Other - see comments (pilates), Time (Minutes): 60, Frequency (Times/Week): 3, Weekly Exercise (Minutes/Week): 180   Goals Addressed             This Visit's Progress    Patient Stated       02/27/2022, wants to stay flexible and mobile, no fractures       Depression Screen    02/27/2022    1:50 PM 02/10/2021    9:21 AM 08/23/2020    1:23 PM 09/03/2018   10:29 AM  PHQ 2/9 Scores  PHQ - 2 Score 0 0 0 0    Fall Risk    02/27/2022    1:50 PM 02/10/2021    9:21 AM 08/23/2020    1:23 PM  [place] in the past year? 0 0 0  Number falls in past yr: 0 0 0  Injury with Fall? 0 0 0  Risk for fall due to : Medication side effect    Follow up Falls evaluation completed;Education provided;Falls prevention discussed Falls evaluation completed     FALL RISK PREVENTION PERTAINING TO THE HOME:  Any stairs in or around the home? Yes  If so, are there any without handrails? No  Home free of loose throw rugs in walkways, pet beds, electrical cords, etc? Yes  Adequate lighting in  your home to reduce risk of falls? Yes   ASSISTIVE DEVICES UTILIZED TO PREVENT FALLS:  Life alert? No  Use of a cane,  walker or w/c? No  Grab bars in the bathroom? No  Shower chair or bench in shower? No  Elevated toilet seat or a handicapped toilet? Yes   TIMED UP AND GO:  Was the test performed? No .      Cognitive Function:        02/27/2022    1:51 PM  6CIT Screen  What Year? 0 points  What month? 0 points  What time? 0 points  Count back from 20 0 points  Months in reverse 0 points  Repeat phrase 0 points  Total Score 0 points    Immunizations Immunization History  Administered Date(s) Administered   Influenza, High Dose Seasonal PF 04/27/2021   Influenza-Unspecified 05/03/2017, 05/25/2020   PFIZER(Purple Top)SARS-COV-2 Vaccination 08/16/2019, 09/05/2019, 09/23/2020   Pneumococcal Conjugate-13 08/23/2020   Td 08/21/1998, 05/11/2009   Zoster Recombinat (Shingrix) 09/03/2018, 11/16/2021   Zoster, Live 06/29/2015    TDAP status: Due, Education has been provided regarding the importance of this vaccine. Advised may receive this vaccine at local pharmacy or Health Dept. Aware to provide a copy of the vaccination record if obtained from local pharmacy or Health Dept. Verbalized acceptance and understanding.  Flu Vaccine status: Up to date  Pneumococcal vaccine status: Up to date  Covid-19 vaccine status: Completed vaccines  Qualifies for Shingles Vaccine? Yes   Zostavax completed Yes   Shingrix Completed?: Yes  Screening Tests Health Maintenance  Topic Date Due   TETANUS/TDAP  05/12/2019   COVID-19 Vaccine (4 - Booster for Pfizer series) 11/18/2020   Pneumonia Vaccine 74+ Years old (2 - PPSV23 if available, else PCV20) 08/23/2021   COLONOSCOPY (Pts 45-48yr Insurance coverage will need to be confirmed)  10/15/2021   INFLUENZA VACCINE  03/21/2022   MAMMOGRAM  09/26/2022   DEXA SCAN  Completed   Hepatitis C Screening  Completed   Zoster Vaccines- Shingrix  Completed   HPV VACCINES  Aged Out    Health Maintenance  Health Maintenance Due  Topic Date Due   TETANUS/TDAP   05/12/2019   COVID-19 Vaccine (4 - Booster for Pfizer series) 11/18/2020   Pneumonia Vaccine 68 Years old (2 - PPSV23 if available, else PCV20) 08/23/2021   COLONOSCOPY (Pts 45-453yrInsurance coverage will need to be confirmed)  10/15/2021    Colorectal cancer screening: Type of screening: Colonoscopy. Completed 10/16/2011. Repeat every 10 years  Mammogram status: Completed 09/26/2021. Repeat every year  Bone Density status: Completed 12/22/2020.   Lung Cancer Screening: (Low Dose CT Chest recommended if Age 68-80ears, 30 pack-year currently smoking OR have quit w/in 15years.) does not qualify.   Lung Cancer Screening Referral: no  Additional Screening:  Hepatitis C Screening: does qualify; Completed 09/03/2018  Vision Screening: Recommended annual ophthalmology exams for early detection of glaucoma and other disorders of the eye. Is the patient up to date with their annual eye exam?  Yes  Who is the provider or what is the name of the office in which the patient attends annual eye exams? Dr. LyPrudencio Burlyf pt is not established with a provider, would they like to be referred to a provider to establish care? No .   Dental Screening: Recommended annual dental exams for proper oral hygiene  Community Resource Referral / Chronic Care Management: CRR required this visit?  No   CCM required this visit?  No  Plan:     I have personally reviewed and noted the following in the patient's chart:   Medical and social history Use of alcohol, tobacco or illicit drugs  Current medications and supplements including opioid prescriptions.  Functional ability and status Nutritional status Physical activity Advanced directives List of other physicians Hospitalizations, surgeries, and ER visits in previous 12 months Vitals Screenings to include cognitive, depression, and falls Referrals and appointments  In addition, I have reviewed and discussed with patient certain preventive  protocols, quality metrics, and best practice recommendations. A written personalized care plan for preventive services as well as general preventive health recommendations were provided to patient.     Kellie Simmering, LPN   0/[id number]/6153   Nurse Notes: none  Due to this being a virtual visit, the after visit summary with patients personalized plan was offered to patient via mail or my-chart. Patient would like to access on my-chart

## 2022-02-27 NOTE — Patient Instructions (Signed)
[name] , Thank you for taking time to come for your Medicare Wellness Visit. I appreciate your ongoing commitment to your health goals. Please review the following plan we discussed and let me know if I can assist you in the future.   Screening recommendations/referrals: Colonoscopy: completed 10/16/2011, due Mammogram: completed 09/26/2021, due 09/27/2022 Bone Density: completed 12/22/2020 Recommended yearly ophthalmology/optometry visit for glaucoma screening and checkup Recommended yearly dental visit for hygiene and checkup  Vaccinations: Influenza vaccine: due 03/21/2022 Pneumococcal vaccine: completed 08/23/2020 Tdap vaccine: due Shingles vaccine: needs second dose   Covid-19: 09/23/2020, 09/05/2019, 08/16/2019  Advanced directives: Please bring a copy of your POA (Power of Attorney) and/or Living Will to your next appointment.   Conditions/risks identified: none  Next appointment: Follow up in one year for your annual wellness visit    Preventive Care 65 Years and Older, Female Preventive care refers to lifestyle choices and visits with your health care provider that can promote health and wellness. What does preventive care include? A yearly physical exam. This is also called an annual well check. Dental exams once or twice a year. Routine eye exams. Ask your health care provider how often you should have your eyes checked. Personal lifestyle choices, including: Daily care of your teeth and gums. Regular physical activity. Eating a healthy diet. Avoiding tobacco and drug use. Limiting alcohol use. Practicing safe sex. Taking low-dose aspirin every day. Taking vitamin and mineral supplements as recommended by your health care provider. What happens during an annual well check? The services and screenings done by your health care provider during your annual well check will depend on your age, overall health, lifestyle risk factors, and family history of disease. Counseling  Your  health care provider may ask you questions about your: Alcohol use. Tobacco use. Drug use. Emotional well-being. Home and relationship well-being. Sexual activity. Eating habits. History of falls. Memory and ability to understand (cognition). Work and work Statistician. Reproductive health. Screening  You may have the following tests or measurements: Height, weight, and BMI. Blood pressure. Lipid and cholesterol levels. These may be checked every 5 years, or more frequently if you are over 22 years old. Skin check. Lung cancer screening. You may have this screening every year starting at age 33 if you have a 30-pack-year history of smoking and currently smoke or have quit within the past 15 years. Fecal occult blood test (FOBT) of the stool. You may have this test every year starting at age 52. Flexible sigmoidoscopy or colonoscopy. You may have a sigmoidoscopy every 5 years or a colonoscopy every 10 years starting at age 42. Hepatitis C blood test. Hepatitis B blood test. Sexually transmitted disease (STD) testing. Diabetes screening. This is done by checking your blood sugar (glucose) after you have not eaten for a while (fasting). You may have this done every 1-3 years. Bone density scan. This is done to screen for osteoporosis. You may have this done starting at age 53. Mammogram. This may be done every 1-2 years. Talk to your health care provider about how often you should have regular mammograms. Talk with your health care provider about your test results, treatment options, and if necessary, the need for more tests. Vaccines  Your health care provider may recommend certain vaccines, such as: Influenza vaccine. This is recommended every year. Tetanus, diphtheria, and acellular pertussis (Tdap, Td) vaccine. You may need a Td booster every 10 years. Zoster vaccine. You may need this after age 68. Pneumococcal 13-valent conjugate (PCV13) vaccine.  One dose is recommended after age  54. Pneumococcal polysaccharide (PPSV23) vaccine. One dose is recommended after age 80. Talk to your health care provider about which screenings and vaccines you need and how often you need them. This information is not intended to replace advice given to you by your health care provider. Make sure you discuss any questions you have with your health care provider. Document Released: 09/03/2015 Document Revised: 04/26/2016 Document Reviewed: 06/08/2015 Elsevier Interactive Patient Education  2017 [place] Prevention in the Home Falls can cause injuries. They can happen to people of all ages. There are many things you can do to make your home safe and to help prevent falls. What can I do on the outside of my home? Regularly fix the edges of walkways and driveways and fix any cracks. Remove anything that might make you trip as you walk through a door, such as a raised step or threshold. Trim any bushes or trees on the path to your home. Use bright outdoor lighting. Clear any walking paths of anything that might make someone trip, such as rocks or tools. Regularly check to see if handrails are loose or broken. Make sure that both sides of any steps have handrails. Any raised decks and porches should have guardrails on the edges. Have any leaves, snow, or ice cleared regularly. Use sand or salt on walking paths during winter. Clean up any spills in your garage right away. This includes oil or grease spills. What can I do in the bathroom? Use night lights. Install grab bars by the toilet and in the tub and shower. Do not use towel bars as grab bars. Use non-skid mats or decals in the tub or shower. If you need to sit down in the shower, use a plastic, non-slip stool. Keep the floor dry. Clean up any water that spills on the floor as soon as it happens. Remove soap buildup in the tub or shower regularly. Attach bath mats securely with double-sided non-slip rug tape. Do not have throw  rugs and other things on the floor that can make you trip. What can I do in the bedroom? Use night lights. Make sure that you have a light by your bed that is easy to reach. Do not use any sheets or blankets that are too big for your bed. They should not hang down onto the floor. Have a firm chair that has side arms. You can use this for support while you get dressed. Do not have throw rugs and other things on the floor that can make you trip. What can I do in the kitchen? Clean up any spills right away. Avoid walking on wet floors. Keep items that you use a lot in easy-to-reach places. If you need to reach something above you, use a strong step stool that has a grab bar. Keep electrical cords out of the way. Do not use floor polish or wax that makes floors slippery. If you must use wax, use non-skid floor wax. Do not have throw rugs and other things on the floor that can make you trip. What can I do with my stairs? Do not leave any items on the stairs. Make sure that there are handrails on both sides of the stairs and use them. Fix handrails that are broken or loose. Make sure that handrails are as long as the stairways. Check any carpeting to make sure that it is firmly attached to the stairs. Fix any carpet that is loose or  worn. Avoid having throw rugs at the top or bottom of the stairs. If you do have throw rugs, attach them to the floor with carpet tape. Make sure that you have a light switch at the top of the stairs and the bottom of the stairs. If you do not have them, ask someone to add them for you. What else can I do to help prevent falls? Wear shoes that: Do not have high heels. Have rubber bottoms. Are comfortable and fit you well. Are closed at the toe. Do not wear sandals. If you use a stepladder: Make sure that it is fully opened. Do not climb a closed stepladder. Make sure that both sides of the stepladder are locked into place. Ask someone to hold it for you, if  possible. Clearly mark and make sure that you can see: Any grab bars or handrails. First and last steps. Where the edge of each step is. Use tools that help you move around (mobility aids) if they are needed. These include: Canes. Walkers. Scooters. Crutches. Turn on the lights when you go into a dark area. Replace any light bulbs as soon as they burn out. Set up your furniture so you have a clear path. Avoid moving your furniture around. If any of your floors are uneven, fix them. If there are any pets around you, be aware of where they are. Review your medicines with your doctor. Some medicines can make you feel dizzy. This can increase your chance of falling. Ask your doctor what other things that you can do to help prevent falls. This information is not intended to replace advice given to you by your health care provider. Make sure you discuss any questions you have with your health care provider. Document Released: 06/03/2009 Document Revised: 01/13/2016 Document Reviewed: 09/11/2014 Elsevier Interactive Patient Education  2017 [employer].

## 2022-03-04 ENCOUNTER — Other Ambulatory Visit: Payer: Self-pay | Admitting: Internal Medicine

## 2022-04-02 ENCOUNTER — Other Ambulatory Visit: Payer: Self-pay | Admitting: Internal Medicine

## 2022-04-11 ENCOUNTER — Other Ambulatory Visit (HOSPITAL_BASED_OUTPATIENT_CLINIC_OR_DEPARTMENT_OTHER): Payer: Self-pay

## 2022-04-11 MED ORDER — ZOSTER VAC RECOMB ADJUVANTED 50 MCG/0.5ML IM SUSR
INTRAMUSCULAR | 0 refills | Status: DC
  Filled 2022-04-11: qty 0.5, 1d supply, fill #0

## 2022-05-15 ENCOUNTER — Other Ambulatory Visit: Payer: Self-pay | Admitting: Internal Medicine

## 2022-05-17 ENCOUNTER — Other Ambulatory Visit: Payer: Self-pay | Admitting: Internal Medicine

## 2022-05-18 ENCOUNTER — Other Ambulatory Visit (HOSPITAL_BASED_OUTPATIENT_CLINIC_OR_DEPARTMENT_OTHER): Payer: Self-pay

## 2022-05-18 MED ORDER — INFLUENZA VAC A&B SA ADJ QUAD 0.5 ML IM PRSY
PREFILLED_SYRINGE | INTRAMUSCULAR | 0 refills | Status: DC
  Filled 2022-05-18: qty 0.5, 1d supply, fill #0

## 2022-05-19 MED ORDER — ALENDRONATE SODIUM 70 MG PO TABS: 70.0000 mg | ORAL_TABLET | ORAL | 2 refills | Status: DC

## 2022-06-19 ENCOUNTER — Encounter: Payer: Medicare Other | Admitting: Internal Medicine

## 2022-08-05 ENCOUNTER — Other Ambulatory Visit: Payer: Self-pay | Admitting: Internal Medicine

## 2022-08-09 ENCOUNTER — Encounter: Payer: Medicare Other | Admitting: Internal Medicine

## 2022-08-10 ENCOUNTER — Ambulatory Visit (INDEPENDENT_AMBULATORY_CARE_PROVIDER_SITE_OTHER): Payer: Medicare Other | Admitting: Family

## 2022-08-10 ENCOUNTER — Encounter: Payer: Self-pay | Admitting: Family

## 2022-08-10 VITALS — BP 118/68 | HR 65 | Temp 98.1°F | Ht 63.5 in | Wt 147.0 lb

## 2022-08-10 DIAGNOSIS — E782 Mixed hyperlipidemia: Secondary | ICD-10-CM | POA: Diagnosis not present

## 2022-08-10 DIAGNOSIS — Z1211 Encounter for screening for malignant neoplasm of colon: Secondary | ICD-10-CM

## 2022-08-10 DIAGNOSIS — Z0000 Encounter for general adult medical examination without abnormal findings: Secondary | ICD-10-CM

## 2022-08-10 DIAGNOSIS — M810 Age-related osteoporosis without current pathological fracture: Secondary | ICD-10-CM

## 2022-08-10 DIAGNOSIS — E559 Vitamin D deficiency, unspecified: Secondary | ICD-10-CM

## 2022-08-10 LAB — COMPREHENSIVE METABOLIC PANEL
ALT: 25 U/L (ref 0–35)
AST: 21 U/L (ref 0–37)
Albumin: 4.5 g/dL (ref 3.5–5.2)
Alkaline Phosphatase: 49 U/L (ref 39–117)
BUN: 10 mg/dL (ref 6–23)
CO2: 32 mEq/L (ref 19–32)
Calcium: 9.4 mg/dL (ref 8.4–10.5)
Chloride: 103 mEq/L (ref 96–112)
Creatinine, Ser: 0.77 mg/dL (ref 0.40–1.20)
GFR: 79.47 mL/min (ref >60.00)
Glucose, Bld: 90 mg/dL (ref 70–99)
Potassium: 4.7 mEq/L (ref 3.5–5.1)
Sodium: 140 mEq/L (ref 135–145)
Total Bilirubin: 0.6 mg/dL (ref 0.2–1.2)
Total Protein: 7 g/dL (ref 6.0–8.3)

## 2022-08-10 LAB — CBC WITH DIFFERENTIAL/PLATELET
Basophils Absolute: 0 10*3/uL (ref 0.0–0.1)
Basophils Relative: 0.6 % (ref 0.0–3.0)
Eosinophils Absolute: 0.1 10*3/uL (ref 0.0–0.7)
Eosinophils Relative: 1 % (ref 0.0–5.0)
HCT: 41.5 % (ref 36.0–46.0)
Hemoglobin: 13.8 g/dL (ref 12.0–15.0)
Lymphocytes Relative: 35.5 % (ref 12.0–46.0)
Lymphs Abs: 2.1 10*3/uL (ref 0.7–4.0)
MCHC: 33.3 g/dL (ref 30.0–36.0)
MCV: 92.1 fl (ref 78.0–100.0)
Monocytes Absolute: 0.6 10*3/uL (ref 0.1–1.0)
Monocytes Relative: 9.4 % (ref 3.0–12.0)
Neutro Abs: 3.2 10*3/uL (ref 1.4–7.7)
Neutrophils Relative %: 53.5 % (ref 43.0–77.0)
Platelets: 245 10*3/uL (ref 150.0–400.0)
RBC: 4.51 Mil/uL (ref 3.87–5.11)
RDW: 14 % (ref 11.5–15.5)
WBC: 6 10*3/uL (ref 4.0–10.5)

## 2022-08-10 LAB — LIPID PANEL
Cholesterol: 183 mg/dL (ref 0–200)
HDL: 69.2 mg/dL (ref >39.00)
LDL Cholesterol: 100 mg/dL — ABNORMAL HIGH (ref 0–99)
NonHDL: 113.32
Total CHOL/HDL Ratio: 3
Triglycerides: 67 mg/dL (ref 0.0–149.0)
VLDL: 13.4 mg/dL (ref 0.0–40.0)

## 2022-08-10 LAB — TSH: TSH: 1.64 u[IU]/mL (ref 0.35–5.50)

## 2022-08-10 LAB — VITAMIN D 25 HYDROXY (VIT D DEFICIENCY, FRACTURES): VITD: 43.69 ng/mL (ref 30.00–100.00)

## 2022-08-10 MED ORDER — ALENDRONATE SODIUM 70 MG PO TABS: 70.0000 mg | ORAL_TABLET | ORAL | 0 refills | Status: DC

## 2022-08-10 NOTE — Patient Instructions (Signed)
Health Maintenance, Female Adopting a healthy lifestyle and getting preventive care are important in promoting health and wellness. Ask your health care provider about: The right schedule for you to have regular tests and exams. Things you can do on your own to prevent diseases and keep yourself healthy. What should I know about diet, weight, and exercise? Eat a healthy diet  Eat a diet that includes plenty of vegetables, fruits, low-fat dairy products, and lean protein. Do not eat a lot of foods that are high in solid fats, added sugars, or sodium. Maintain a healthy weight Body mass index (BMI) is used to identify weight problems. It estimates body fat based on height and weight. Your health care provider can help determine your BMI and help you achieve or maintain a healthy weight. Get regular exercise Get regular exercise. This is one of the most important things you can do for your health. Most adults should: Exercise for at least 150 minutes each week. The exercise should increase your heart rate and make you sweat (moderate-intensity exercise). Do strengthening exercises at least twice a week. This is in addition to the moderate-intensity exercise. Spend less time sitting. Even light physical activity can be beneficial. Watch cholesterol and blood lipids Have your blood tested for lipids and cholesterol at 68 years of age, then have this test every 5 years. Have your cholesterol levels checked more often if: Your lipid or cholesterol levels are high. You are older than 68 years of age. You are at high risk for heart disease. What should I know about cancer screening? Depending on your health history and family history, you may need to have cancer screening at various ages. This may include screening for: Breast cancer. Cervical cancer. Colorectal cancer. Skin cancer. Lung cancer. What should I know about heart disease, diabetes, and high blood pressure? Blood pressure and heart  disease High blood pressure causes heart disease and increases the risk of stroke. This is more likely to develop in people who have high blood pressure readings or are overweight. Have your blood pressure checked: Every 3-5 years if you are 18-39 years of age. Every year if you are 40 years old or older. Diabetes Have regular diabetes screenings. This checks your fasting blood sugar level. Have the screening done: Once every three years after age 40 if you are at a normal weight and have a low risk for diabetes. More often and at a younger age if you are overweight or have a high risk for diabetes. What should I know about preventing infection? Hepatitis B If you have a higher risk for hepatitis B, you should be screened for this virus. Talk with your health care provider to find out if you are at risk for hepatitis B infection. Hepatitis C Testing is recommended for: Everyone born from 1945 through 1965. Anyone with known risk factors for hepatitis C. Sexually transmitted infections (STIs) Get screened for STIs, including gonorrhea and chlamydia, if: You are sexually active and are younger than 68 years of age. You are older than 68 years of age and your health care provider tells you that you are at risk for this type of infection. Your sexual activity has changed since you were last screened, and you are at increased risk for chlamydia or gonorrhea. Ask your health care provider if you are at risk. Ask your health care provider about whether you are at high risk for HIV. Your health care provider may recommend a prescription medicine to help prevent HIV   infection. If you choose to take medicine to prevent HIV, you should first get tested for HIV. You should then be tested every 3 months for as long as you are taking the medicine. Pregnancy If you are about to stop having your period (premenopausal) and you may become pregnant, seek counseling before you get pregnant. Take 400 to 800  micrograms (mcg) of folic acid every day if you become pregnant. Ask for birth control (contraception) if you want to prevent pregnancy. Osteoporosis and menopause Osteoporosis is a disease in which the bones lose minerals and strength with aging. This can result in bone fractures. If you are 68 years old or older, or if you are at risk for osteoporosis and fractures, ask your health care provider if you should: Be screened for bone loss. Take a calcium or vitamin D supplement to lower your risk of fractures. Be given hormone replacement therapy (HRT) to treat symptoms of menopause. Follow these instructions at home: Alcohol use Do not drink alcohol if: Your health care provider tells you not to drink. You are pregnant, may be pregnant, or are planning to become pregnant. If you drink alcohol: Limit how much you have to: 0-1 drink a day. Know how much alcohol is in your drink. In the [place] one drink equals one 12 oz bottle of beer (355 mL), one 5 oz glass of wine (148 mL), or one 1 oz glass of hard liquor (44 mL). Lifestyle Do not use any products that contain nicotine or tobacco. These products include cigarettes, chewing tobacco, and vaping devices, such as e-cigarettes. If you need help quitting, ask your health care provider. Do not use street drugs. Do not share needles. Ask your health care provider for help if you need support or information about quitting drugs. General instructions Schedule regular health, dental, and eye exams. Stay current with your vaccines. Tell your health care provider if: You often feel depressed. You have ever been abused or do not feel safe at home. Summary Adopting a healthy lifestyle and getting preventive care are important in promoting health and wellness. Follow your health care provider's instructions about healthy diet, exercising, and getting tested or screened for diseases. Follow your health care provider's instructions on monitoring your  cholesterol and blood pressure. This information is not intended to replace advice given to you by your health care provider. Make sure you discuss any questions you have with your health care provider. Document Revised: 12/27/2020 Document Reviewed: 12/27/2020 Elsevier Patient Education  [place] and Cholesterol Restricted Eating Plan Eating a diet that limits fat and cholesterol may help lower your risk for heart disease and other conditions. Your body needs fat and cholesterol for basic functions, but eating too much of these things can be harmful to your health. Your health care provider may order lab tests to check your blood fat (lipid) and cholesterol levels. This helps your health care provider understand your risk for certain conditions and whether you need to make diet changes. Work with your health care provider or dietitian to make an eating plan that is right for you. Your plan includes: Limit your fat intake to ______% or less of your total calories a day. This is ______g of fat per day. Limit your saturated fat intake to ______% or less of your total calories a day. This is ______g of saturated fat per day. Limit the amount of cholesterol in your diet to less than _________mg a day. Eat ___________ g of fiber  a day. What are tips for following this plan? General guidelines If you are overweight, work with your health care provider to lose weight safely. Losing just 5-10% of your body weight can improve your overall health and help prevent diseases such as diabetes and heart disease. Avoid: Foods with added sugar. Fried foods. Foods that contain partially hydrogenated oils, including stick margarine, some tub margarines, cookies, crackers, and other baked goods. If you drink alcohol: Limit how much you have to: 0-1 drink a day for women who are not pregnant. 0-2 drinks a day for men. Know how much alcohol is in a drink. In the [place] one drink equals one 12 oz  bottle of beer (355 mL), one 5 oz glass of wine (148 mL), or one 1 oz glass of hard liquor (44 mL). Reading food labels Check food labels for: Trans fats or partially hydrogenated oils. Avoid foods that contain these. High amounts of saturated fat. Choose foods that are low in saturated fat (less than 2 g). The amount of cholesterol in each serving. The amount of fiber in each serving. Choose foods with healthy fats, such as: Monounsaturated and polyunsaturated fats. These include olive and canola oil, flaxseeds, walnuts, almonds, and seeds. Omega-3 fats. These are found in foods such as salmon, mackerel, sardines, tuna, flaxseed oil, and ground flaxseeds. Choose grain products that have whole grains. Look for the word "whole" as the first word in the ingredient list. Cooking Bean foods using methods other than frying. Baking, boiling, grilling, and broiling are some healthy options. Eat more home-cooked food and less restaurant, buffet, and fast food. Avoid cooking using saturated fats. Animal sources of saturated fats include meats, butter, and cream. Plant sources of saturated fats include palm oil, palm kernel oil, and coconut oil. Meal planning  At meals, imagine dividing your plate into fourths: Fill one-half of your plate with vegetables, green salads, and fruit. Fill one-fourth of your plate with whole grains. Fill one-fourth of your plate with lean protein foods. Eat fish that is high in omega-3 fats at least two times a week. Eat more foods that contain fiber, such as whole grains, beans, apples, pears, berries, broccoli, carrots, peas, and barley. These foods help promote healthy cholesterol levels in the blood. What foods should I eat? Fruits All fresh, canned (in natural juice), or frozen fruits. Vegetables Fresh or frozen vegetables (raw, steamed, roasted, or grilled). Green salads. Grains Whole grains, such as whole wheat or whole grain breads, crackers, cereals, and  pasta. Unsweetened oatmeal, bulgur, barley, quinoa, or brown rice. Corn or whole wheat flour tortillas. Meats and other proteins Ground beef (85% or leaner), grass-fed beef, or beef trimmed of fat. Skinless chicken or [place]. Ground chicken or [place]. Pork trimmed of fat. All fish and seafood. Egg whites. Dried beans, peas, or lentils. Unsalted nuts or seeds. Unsalted canned beans. Natural nut butters without added sugar and oil. Dairy Low-fat or nonfat dairy products, such as skim or 1% milk, 2% or reduced-fat cheeses, low-fat and fat-free ricotta or cottage cheese, or plain low-fat and nonfat yogurt. Fats and oils Tub margarine without trans fats. Light or reduced-fat mayonnaise and salad dressings. Avocado. Olive, canola, sesame, or safflower oils. The items listed above may not be a complete list of foods and beverages you can eat. Contact a dietitian for more information. What foods should I avoid? Fruits Canned fruit in heavy syrup. Fruit in cream or butter sauce. Fried fruit. Vegetables Vegetables cooked in cheese, cream, or butter sauce.  Fried vegetables. Grains White bread. White pasta. White rice. Cornbread. Bagels, pastries, and croissants. Crackers and snack foods that contain trans fat and hydrogenated oils. Meats and other proteins Fatty cuts of meat. Ribs, chicken wings, bacon, sausage, bologna, salami, chitterlings, fatback, hot dogs, bratwurst, and packaged lunch meats. Liver and organ meats. Whole eggs and egg yolks. Chicken and [place] with skin. Fried meat. Dairy Whole or 2% milk, cream, half-and-half, and cream cheese. Whole milk cheeses. Whole-fat or sweetened yogurt. Full-fat cheeses. Nondairy creamers and whipped toppings. Processed cheese, cheese spreads, and cheese curds. Fats and oils Butter, stick margarine, lard, shortening, ghee, or bacon fat. Coconut, palm kernel, and palm oils. Beverages Alcohol. Sugar-sweetened drinks such as sodas, lemonade, and fruit  drinks. Sweets and desserts Corn syrup, sugars, honey, and molasses. Candy. Jam and jelly. Syrup. Sweetened cereals. Cookies, pies, cakes, donuts, muffins, and ice cream. The items listed above may not be a complete list of foods and beverages you should avoid. Contact a dietitian for more information. Summary Your body needs fat and cholesterol for basic functions. However, eating too much of these things can be harmful to your health. Work with your health care provider and dietitian to follow a diet that limits fat and cholesterol. Doing this may help lower your risk for heart disease and other conditions. Choose healthy fats, such as monounsaturated and polyunsaturated fats, and foods high in omega-3 fatty acids. Eat fiber-rich foods, such as whole grains, beans, peas, fruits, and vegetables. Limit or avoid alcohol, fried foods, and foods high in saturated fats, partially hydrogenated oils, and sugar. This information is not intended to replace advice given to you by your health care provider. Make sure you discuss any questions you have with your health care provider. Document Revised: 12/17/2020 Document Reviewed: 12/17/2020 Elsevier Patient Education  [place].

## 2022-08-10 NOTE — Progress Notes (Signed)
Complete physical exam  Patient: Katherine Rodriguez   DOB: [birth year]   66 y.o. Female  MRN: [id number]  Subjective:    Chief Complaint  Patient presents with   Annual Exam    Katherine Rodriguez is a 66 y.o. female who presents today for a complete physical exam. She reports consuming a general diet. Gym/ health club routine includes low impact aerobics, mod to heavy weightlifting, treadmill, and walking on track . She generally feels well. She reports sleeping well. She does not have additional problems to discuss today.   Would like for her colonoscopy to be done through [facility] because they have her medical records.    Most recent fall risk assessment:    08/10/2022    2:14 PM  Fall Risk   Falls in the past year? 0  Number falls in past yr: 0  Injury with Fall? 0  Risk for fall due to : No Fall Risks  Follow up Falls evaluation completed     Most recent depression screenings:    08/10/2022    2:14 PM 02/27/2022    1:50 PM  PHQ 2/9 Scores  PHQ - 2 Score 0 0  PHQ- 9 Score 2         Patient Care Team: Burnis Medin, MD as PCP - Buena Irish, NP as Nurse Practitioner (Obstetrics and Gynecology) Gaynelle Arabian, MD as Consulting Physician (Orthopedic Surgery) Sydnee Levans, MD as Referring Physician (Dermatology)   Outpatient Medications Prior to Visit  Medication Sig   Cholecalciferol (VITAMIN D3) 5000 UNITS CAPS Take by mouth. 1 CAPSULE THREE TIMES WEEKLY   influenza vaccine adjuvanted (FLUAD) 0.5 ML injection Inject into the muscle.   loratadine (CLARITIN) 10 MG tablet Take 10 mg daily by mouth.   MYRBETRIQ 50 MG TB24 tablet Take 50 mg by mouth daily. Takes as needed   naproxen sodium (ALEVE) 220 MG tablet    polyethylene glycol powder (GLYCOLAX/MIRALAX) 17 GM/SCOOP powder    Tdap (BOOSTRIX) 5-2.5-18.5 LF-MCG/0.5 injection Inject into the muscle.   valACYclovir (VALTREX) 1000 MG tablet TAKE 2 TABLETS BY MOUTH TWICE A DAY AS NEEDED FOR FEVER  BLISTERS   vitamin C (ASCORBIC ACID) 500 MG tablet Take 500 mg three times weekly   Zoster Vaccine Adjuvanted [facility]) injection Inject into the muscle.   Zoster Vaccine Adjuvanted [facility]) injection Inject into the muscle.   [DISCONTINUED] alendronate (FOSAMAX) 70 MG tablet TAKE 1 TABLET (70 MG TOTAL) BY MOUTH ONCE A WEEK. TAKE WITH A FULL GLASS OF WATER ON AN EMPTY STOMACH.   methocarbamol (ROBAXIN) 500 MG tablet Take 1-2 tablets (500-1,000 mg total) by mouth every 8 (eight) hours as needed for muscle spasms. (Patient not taking: Reported on 02/10/2021)   Multiple Vitamins-Minerals (ALGAE BASED CALCIUM PO) Take by mouth. (Patient not taking: Reported on 02/10/2021)   No facility-administered medications prior to visit.    Review of Systems  All other systems reviewed and are negative.         Objective:     BP 118/68 (BP Location: Left Arm, Patient Position: Sitting, Cuff Size: Normal)   Pulse 65   Temp 98.1 F (36.7 C) (Oral)   Ht 5' 3.5" (1.613 m)   Wt 147 lb (66.7 kg)   SpO2 97%   BMI 25.63 kg/m    Physical Exam Vitals and nursing note reviewed.  Constitutional:      Appearance: Normal appearance. She is normal weight.  HENT:     Head: Normocephalic  and atraumatic.     Right Ear: Tympanic membrane, ear canal and external ear normal.     Left Ear: Tympanic membrane, ear canal and external ear normal.     Nose: Nose normal.     Mouth/Throat:     Mouth: Mucous membranes are moist.     Pharynx: Oropharynx is clear.  Cardiovascular:     Rate and Rhythm: Normal rate and regular rhythm.  Pulmonary:     Effort: Pulmonary effort is normal.     Breath sounds: Normal breath sounds.  Abdominal:     General: Abdomen is flat. Bowel sounds are normal.     Palpations: Abdomen is soft.  Musculoskeletal:        General: Normal range of motion.     Cervical back: Normal range of motion and neck supple.  Skin:    General: Skin is warm and dry.  Neurological:     General:  No focal deficit present.     Mental Status: She is alert and oriented to person, place, and time.  Psychiatric:        Mood and Affect: Mood normal.        Behavior: Behavior normal.      No results found for any visits on 08/10/22.     Assessment & Plan:    Routine Health Maintenance and Physical Exam  Immunization History  Administered Date(s) Administered   Fluad Quad(high Dose 65+) 05/18/2022   Influenza, High Dose Seasonal PF 04/27/2021   Influenza-Unspecified 05/03/2017, 05/25/2020   PFIZER(Purple Top)SARS-COV-2 Vaccination 08/16/2019, 09/05/2019, 09/23/2020   Pneumococcal Conjugate-13 08/23/2020   Td 08/21/1998, 08/21/2004, 05/11/2009   Tdap 02/17/2021   Zoster Recombinat (Shingrix) 09/03/2018, 11/16/2021   Zoster, Live 06/29/2015    Health Maintenance  Topic Date Due   Pneumonia Vaccine 72+ Years old (2 - PPSV23 or PCV20) 08/23/2021   COLONOSCOPY (Pts 45-77yr Insurance coverage will need to be confirmed)  10/15/2021   COVID-19 Vaccine (4 - 2023-24 season) 04/21/2022   MAMMOGRAM  09/26/2022   Medicare Annual Wellness (AWV)  02/28/2023   DTaP/Tdap/Td (5 - Td or Tdap) 02/18/2031   INFLUENZA VACCINE  Completed   DEXA SCAN  Completed   Hepatitis C Screening  Completed   Zoster Vaccines- Shingrix  Completed   HPV VACCINES  Aged Out    Discussed health benefits of physical activity, and encouraged her to engage in regular exercise appropriate for her age and condition.  Problem List Items Addressed This Visit     Osteoporosis - Primary   Relevant Medications   alendronate (FOSAMAX) 70 MG tablet   Other Relevant Orders   Vitamin D, 25-hydroxy   CBC with Differential   Comprehensive metabolic panel   TSH   DG Bone Density   Other Visit Diagnoses     Vitamin D deficiency       Relevant Orders   Vitamin D, 25-hydroxy   CBC with Differential   Comprehensive metabolic panel   TSH   DG Bone Density   Mixed hyperlipidemia       Relevant Orders   TSH    Lipid panel   Routine general medical examination at a health care facility       Relevant Orders   CBC with Differential   Comprehensive metabolic panel   Screen for colon cancer       Relevant Orders   Ambulatory referral to Gastroenterology      No follow-ups on file. Call the office with any questions or  concerns. Recheck as scheduled and sooner as needed    Kennyth Arnold, FNP

## 2022-08-15 ENCOUNTER — Encounter: Payer: Medicare Other | Admitting: Internal Medicine

## 2022-08-17 ENCOUNTER — Other Ambulatory Visit: Payer: Self-pay | Admitting: Internal Medicine

## 2022-08-17 DIAGNOSIS — Z1231 Encounter for screening mammogram for malignant neoplasm of breast: Secondary | ICD-10-CM

## 2022-08-27 ENCOUNTER — Telehealth: Payer: Medicare Other | Admitting: Family Medicine

## 2022-08-27 DIAGNOSIS — U071 COVID-19: Secondary | ICD-10-CM

## 2022-08-27 MED ORDER — MOLNUPIRAVIR EUA 200MG CAPSULE: 4.0000 | ORAL_CAPSULE | Freq: Two times a day (BID) | ORAL | 0 refills | Status: AC

## 2022-08-27 NOTE — Progress Notes (Signed)
Virtual Visit Consent   Katherine Rodriguez, you are scheduled for a virtual visit with a [facility] provider today. Just as with appointments in the office, your consent must be obtained to participate. Your consent will be active for this visit and any virtual visit you may have with one of our providers in the next 365 days. If you have a MyChart account, a copy of this consent can be sent to you electronically.  As this is a virtual visit, video technology does not allow for your provider to perform a traditional examination. This may limit your provider's ability to fully assess your condition. If your provider identifies any concerns that need to be evaluated in person or the need to arrange testing (such as labs, EKG, etc.), we will make arrangements to do so. Although advances in technology are sophisticated, we cannot ensure that it will always work on either your end or our end. If the connection with a video visit is poor, the visit may have to be switched to a telephone visit. With either a video or telephone visit, we are not always able to ensure that we have a secure connection.  By engaging in this virtual visit, you consent to the provision of healthcare and authorize for your insurance to be billed (if applicable) for the services provided during this visit. Depending on your insurance coverage, you may receive a charge related to this service.  I need to obtain your verbal consent now. Are you willing to proceed with your visit today? Katherine Rodriguez has provided verbal consent on 08/27/2022 for a virtual visit (video or telephone). Dellia Nims, FNP  Date: 08/27/2022 12:10 PM  Virtual Visit via Video Note   I, Dellia Nims, connected with  Katherine Rodriguez  ([id number], May 07, 1956) on 08/27/22 at 12:15 PM EST by a video-enabled telemedicine application and verified that I am speaking with the correct person using two identifiers.  Location: Patient: Virtual Visit Location Patient:  Home Provider: Virtual Visit Location Provider: Home Office   I discussed the limitations of evaluation and management by telemedicine and the availability of in person appointments. The patient expressed understanding and agreed to proceed.    History of Present Illness: Katherine Rodriguez is a 67 y.o. who identifies as a female who was assigned female at birth, and is being seen today for covid positive testing with sx for 3 days. Sx are runny nose, head congestion headache, no fever, chills wheezing or sob. Her husband also has covid and has done well on molnupiravir. Marland Kitchen  HPI: HPI  Problems:  Patient Active Problem List   Diagnosis Date Noted   Osteoporosis 06/28/2017   OA (osteoarthritis) of hip 11/05/2013   Hip arthritis 10/15/2013   S/P hip replacement 08/01/2011   Visit for preventive health examination 08/01/2011   Osteopenia 08/01/2011   History of herpes labialis 08/01/2011   ARTHRITIS, RIGHT HIP 05/11/2009   TACHYCARDIA 01/20/2008   GERD 10/08/2007    Allergies: No Known Allergies Medications:  Current Outpatient Medications:    molnupiravir EUA (LAGEVRIO) 200 mg CAPS capsule, Take 4 capsules (800 mg total) by mouth 2 (two) times daily for 5 days., Disp: 40 capsule, Rfl: 0   alendronate (FOSAMAX) 70 MG tablet, Take 1 tablet (70 mg total) by mouth once a week. Take with a full glass of water on an empty stomach., Disp: 4 tablet, Rfl: 0   Cholecalciferol (VITAMIN D3) 5000 UNITS CAPS, Take by mouth. 1 CAPSULE THREE TIMES WEEKLY,  Disp: , Rfl:    influenza vaccine adjuvanted (FLUAD) 0.5 ML injection, Inject into the muscle., Disp: 0.5 mL, Rfl: 0   loratadine (CLARITIN) 10 MG tablet, Take 10 mg daily by mouth., Disp: , Rfl:    MYRBETRIQ 50 MG TB24 tablet, Take 50 mg by mouth daily. Takes as needed, Disp: , Rfl:    naproxen sodium (ALEVE) 220 MG tablet, , Disp: , Rfl:    polyethylene glycol powder (GLYCOLAX/MIRALAX) 17 GM/SCOOP powder, , Disp: , Rfl:    Tdap (BOOSTRIX) 5-2.5-18.5  LF-MCG/0.5 injection, Inject into the muscle., Disp: 0.5 mL, Rfl: 0   valACYclovir (VALTREX) 1000 MG tablet, TAKE 2 TABLETS BY MOUTH TWICE A DAY AS NEEDED FOR FEVER BLISTERS, Disp: 30 tablet, Rfl: 5   vitamin C (ASCORBIC ACID) 500 MG tablet, Take 500 mg three times weekly, Disp: , Rfl:    Zoster Vaccine Adjuvanted (SHINGRIX) injection, Inject into the muscle., Disp: 0.5 mL, Rfl: 0   Zoster Vaccine Adjuvanted (SHINGRIX) injection, Inject into the muscle., Disp: 0.5 mL, Rfl: 0  Observations/Objective: Patient is well-developed, well-nourished in no acute distress.  Resting comfortably  at home.  Head is normocephalic, atraumatic.  No labored breathing.  Speech is clear and coherent with logical content.  Patient is alert and oriented at baseline.    Assessment and Plan: 1. COVID  Increase fluids, humidifier at night, tylenol or ibuprofen as directed, MVI with VIT D and zinc- urgent care if sx worsen.   Follow Up Instructions: I discussed the assessment and treatment plan with the patient. The patient was provided an opportunity to ask questions and all were answered. The patient agreed with the plan and demonstrated an understanding of the instructions.  A copy of instructions were sent to the patient via MyChart unless otherwise noted below.     The patient was advised to call back or seek an in-person evaluation if the symptoms worsen or if the condition fails to improve as anticipated.  Time:  I spent 10 minutes with the patient via telehealth technology discussing the above problems/concerns.    Dellia Nims, FNP

## 2022-08-27 NOTE — Patient Instructions (Signed)
Quarantine and Isolation Quarantine and isolation refer to local and travel restrictions to protect the public and travelers from contagious diseases that constitute a public health threat. Contagious diseases are diseases that can spread from one person to another. Quarantine and isolation help to protect the public by preventing exposure to people who have or may have a contagious disease. Isolation separates people who are sick with a contagious disease from people who are not sick. Quarantine separates and restricts the movement of people who were exposed to a contagious disease to see if they become sick. You may be put in quarantine or isolation if you have been exposed to or diagnosed with any of the following diseases: Severe acute respiratory syndromes, such as COVID-19. Cholera. Diphtheria. Tuberculosis. Plague. Smallpox. Yellow fever. Viral hemorrhagic fevers, such as [place], Ebola, and [place]. When to quarantine or isolate Follow these rules, whether you have been vaccinated or not: Stay home and isolate from others when you are sick with a contagious disease. Isolate when you test positive for a contagious disease, even if you do not have symptoms. Isolate if you are sick and suspect that you may have a contagious disease. If you suspect that you have a contagious disease, get tested. If your test results are negative, you can end your isolation. If your test results are positive, follow the full isolation recommendations as told by your health care provider or local health authorities. Quarantine and stay away from others when you have been in close contact with someone who has tested positive for a contagious disease. Close contact is defined as being less than 6 ft (1.8 m) away from an infected person for a total of 15 minutes or more over a 24-hour period. Do not go to places where you are unable to wear a mask, such as restaurants and some gyms. Stay home and separate  from others as much as possible. Avoid being around people who may get very sick from the contagious disease that you have. Use a separate bathroom, if possible. Do not travel. For travel guidance, visit the CDC's travel webpage at wwwnc.cdc.gov/travel/ Follow these instructions at home: Medicines  Take over-the-counter and prescription medicines as told by your health care provider. Finish all antibiotic medicine even when you start to feel better. Stay up to date with all your vaccines. Get scheduled vaccines and boosters as recommended by your health care provider. Lifestyle Wear a high-quality mask if you must be around others at home and in public, if recommended. Improve air flow (ventilation) at home to help prevent the disease from spreading to other people, if possible. Do not share personal household items, like cups, towels, and utensils. Practice everyday hygiene and cleaning. General instructions Talk to your health care provider if you have a weakened body defense system (immune system). People with a weakened immune system may have a reduced immune response to vaccines. You may need to follow current prevention measures, including wearing a well-fitting mask, avoiding crowds, and avoiding poorly ventilated indoor places. Monitor symptoms and follow health care provider instructions, which may include resting, drinking fluids, and taking medicines. Follow specific isolation and quarantine recommendations if you are in places that can lead to disease outbreaks, such as correctional and detention facilities, homeless shelters, and cruise ships. Return to your normal activities as told by your health care provider. Ask your health care provider what activities are safe for you. Keep all follow-up visits. This is important. Where to find more information CDC: www.cdc.gov/quarantine/index.html Contact   a health care provider if: You have a fever. You have signs and symptoms that  return or get worse after isolation. Get help right away if: You have difficulty breathing. You have chest pain. These symptoms may be an emergency. Get help right away. Call 911. Do not wait to see if the symptoms will go away. Do not drive yourself to the hospital. Summary Isolation and quarantine help protect the public by preventing exposure to people who have or may have a contagious disease. Isolate when you are sick or when you test positive, even if you do not have symptoms. Quarantine and stay away from others when you have been in close contact with someone who has tested positive for a contagious disease. This information is not intended to replace advice given to you by your health care provider. Make sure you discuss any questions you have with your health care provider. Document Revised: 08/18/2021 Document Reviewed: 07/28/2021 Elsevier Patient Education  [place] STMHD-62, or coronavirus disease 2019, is an infection that is caused by a new (novel) coronavirus called SARS-CoV-2. COVID-19 can cause many symptoms. In some people, the virus may not cause any symptoms. In others, it may cause mild or severe symptoms. Some people with severe infection develop severe disease. What are the causes? This illness is caused by a virus. The virus may be in the air as tiny specks of fluid (aerosols) or droplets, or it may be on surfaces. You may catch the virus by: Breathing in droplets from an infected person. Droplets can be spread by a person breathing, speaking, singing, coughing, or sneezing. Touching something, like a table or a doorknob, that has virus on it (is contaminated) and then touching your mouth, nose, or eyes. What increases the risk? Risk for infection: You are more likely to get infected with the COVID-19 virus if: You are within 6 ft (1.8 m) of a person with COVID-19 for 15 minutes or longer. You are providing care for a person who is infected with  COVID-19. You are in close personal contact with other people. Close personal contact includes hugging, kissing, or sharing eating or drinking utensils. Risk for serious illness caused by COVID-19: You are more likely to get seriously ill from the COVID-19 virus if: You have cancer. You have a long-term (chronic) disease, such as: Chronic lung disease. This includes pulmonary embolism, chronic obstructive pulmonary disease, and cystic fibrosis. Long-term disease that lowers your body's ability to fight infection (immunocompromise). Serious cardiac conditions, such as heart failure, coronary artery disease, or cardiomyopathy. Diabetes. Chronic kidney disease. Liver diseases. These include cirrhosis, nonalcoholic fatty liver disease, alcoholic liver disease, or autoimmune hepatitis. You have obesity. You are pregnant or were recently pregnant. You have sickle cell disease. What are the signs or symptoms? Symptoms of this condition can range from mild to severe. Symptoms may appear any time from 2 to 14 days after being exposed to the virus. They include: Fever or chills. Shortness of breath or trouble breathing. Feeling tired or very tired. Headaches, body aches, or muscle aches. Runny or stuffy nose, sneezing, coughing, or sore throat. New loss of taste or smell. This is rare. Some people may also have stomach problems, such as nausea, vomiting, or diarrhea. Other people may not have any symptoms of COVID-19. How is this diagnosed? This condition may be diagnosed by testing samples to check for the COVID-19 virus. The most common tests are the PCR test and the antigen test. Tests may be done  in the lab or at home. They include: Using a swab to take a sample of fluid from the back of your nose and throat (nasopharyngeal fluid), from your nose, or from your throat. Testing a sample of saliva from your mouth. Testing a sample of coughed-up mucus from your lungs (sputum). How is this  treated? Treatment for COVID-19 infection depends on the severity of the condition. Mild symptoms can be managed at home with rest, fluids, and over-the-counter medicines. Serious symptoms may be treated in a hospital intensive care unit (ICU). Treatment in the ICU may include: Supplemental oxygen. Extra oxygen is given through a tube in the nose, a face mask, or a hood. Medicines. These may include: Antivirals, such as monoclonal antibodies. These help your body fight off certain viruses that can cause disease. Anti-inflammatories, such as corticosteroids. These reduce inflammation and suppress the immune system. Antithrombotics. These prevent or treat blood clots, if they develop. Convalescent plasma. This helps boost your immune system, if you have an underlying immunosuppressive condition or are getting immunosuppressive treatments. Prone positioning. This means you will lie on your stomach. This helps oxygen to get into your lungs. Infection control measures. If you are at risk for more serious illness caused by COVID-19, your health care provider may prescribe two long-acting monoclonal antibodies, given together every 6 months. How is this prevented? To protect yourself: Use preventive medicine (pre-exposure prophylaxis). You may get pre-exposure prophylaxis if you have moderate or severe immunocompromise. Get vaccinated. Anyone 59 months old or older who meets guidelines can get a COVID-19 vaccine or vaccine series. This includes people who are pregnant or making breast milk (lactating). Get an added dose of COVID-19 vaccine after your first vaccine or vaccine series if you have moderate to severe immunocompromise. This applies if you have had a solid organ transplant or have been diagnosed with an immunocompromising condition. You should get the added dose 4 weeks after you got the first COVID-19 vaccine or vaccine series. If you get an mRNA vaccine, you will need a 3-dose primary  series. If you get the J&J/Janssen vaccine, you will need a 2-dose primary series, with the second dose being an mRNA vaccine. Talk to your health care provider about getting experimental monoclonal antibodies. This treatment is approved under emergency use authorization to prevent severe illness before or after being exposed to the COVID-19 virus. You may be given monoclonal antibodies if: You have moderate or severe immunocompromise. This includes treatments that lower your immune response. People with immunocompromise may not develop protection against COVID-19 when they are vaccinated. You cannot be vaccinated. You may not get a vaccine if you have a severe allergic reaction to the vaccine or its components. You are not fully vaccinated. You are in a facility where COVID-19 is present and: Are in close contact with a person who is infected with the COVID-19 virus. Are at high risk of being exposed to the COVID-19 virus. You are at risk of illness from new variants of the COVID-19 virus. To protect others: If you have symptoms of COVID-19, take steps to prevent the virus from spreading to others. Stay home. Leave your house only to get medical care. Do not use public transit, if possible. Do not travel while you are sick. Wash your hands often with soap and water for at least 20 seconds. If soap and water are not available, use alcohol-based hand sanitizer. Make sure that all people in your household wash their hands well and often. Cough or sneeze  into a tissue or your sleeve or elbow. Do not cough or sneeze into your hand or into the air. Where to find more information Centers for Disease Control and Prevention: CharmCourses.be World Health Organization: https://www.castaneda.info/ Get help right away if: You have trouble breathing. You have pain or pressure in your chest. You are confused. You have bluish lips and fingernails. You have trouble waking from sleep. You  have symptoms that get worse. These symptoms may be an emergency. Get help right away. Call 911. Do not wait to see if the symptoms will go away. Do not drive yourself to the hospital. Summary COVID-19 is an infection that is caused by a new coronavirus. Sometimes, there are no symptoms. Other times, symptoms range from mild to severe. Some people with a severe COVID-19 infection develop severe disease. The virus that causes COVID-19 can spread from person to person through droplets or aerosols from breathing, speaking, singing, coughing, or sneezing. Mild symptoms of COVID-19 can be managed at home with rest, fluids, and over-the-counter medicines. This information is not intended to replace advice given to you by your health care provider. Make sure you discuss any questions you have with your health care provider. Document Revised: 07/26/2021 Document Reviewed: 07/28/2021 Elsevier Patient Education  [place].

## 2022-09-12 ENCOUNTER — Other Ambulatory Visit: Payer: Self-pay | Admitting: Internal Medicine

## 2022-09-29 ENCOUNTER — Other Ambulatory Visit: Payer: Self-pay | Admitting: Family

## 2022-10-03 ENCOUNTER — Encounter: Payer: Self-pay | Admitting: Internal Medicine

## 2022-10-05 ENCOUNTER — Other Ambulatory Visit: Payer: Self-pay

## 2022-10-06 ENCOUNTER — Ambulatory Visit: Payer: Medicare Other

## 2022-10-09 MED ORDER — ALENDRONATE SODIUM 70 MG PO TABS: 70.0000 mg | ORAL_TABLET | ORAL | 1 refills | Status: DC

## 2022-10-09 NOTE — Telephone Encounter (Signed)
Sending n 90 days and 1 refill

## 2022-10-20 ENCOUNTER — Ambulatory Visit
Admission: RE | Admit: 2022-10-20 | Discharge: 2022-10-20 | Disposition: A | Discharge status: Home / Self Care | Payer: Medicare Other | Source: Ambulatory Visit | Attending: Internal Medicine | Admitting: Internal Medicine

## 2022-10-20 DIAGNOSIS — Z1231 Encounter for screening mammogram for malignant neoplasm of breast: Secondary | ICD-10-CM

## 2022-11-15 ENCOUNTER — Other Ambulatory Visit (HOSPITAL_BASED_OUTPATIENT_CLINIC_OR_DEPARTMENT_OTHER): Payer: Self-pay

## 2022-12-29 ENCOUNTER — Other Ambulatory Visit (HOSPITAL_BASED_OUTPATIENT_CLINIC_OR_DEPARTMENT_OTHER): Payer: Self-pay

## 2023-02-01 ENCOUNTER — Ambulatory Visit
Admission: RE | Admit: 2023-02-01 | Discharge: 2023-02-01 | Disposition: A | Discharge status: Home / Self Care | Payer: Medicare Other | Source: Ambulatory Visit | Attending: Family | Admitting: Family

## 2023-02-01 DIAGNOSIS — E559 Vitamin D deficiency, unspecified: Secondary | ICD-10-CM

## 2023-02-01 DIAGNOSIS — M810 Age-related osteoporosis without current pathological fracture: Secondary | ICD-10-CM

## 2023-03-01 ENCOUNTER — Telehealth (INDEPENDENT_AMBULATORY_CARE_PROVIDER_SITE_OTHER): Payer: Medicare Other | Admitting: Family Medicine

## 2023-03-01 DIAGNOSIS — Z0000 Encounter for general adult medical examination without abnormal findings: Secondary | ICD-10-CM | POA: Diagnosis not present

## 2023-03-01 NOTE — Patient Instructions (Signed)
I really enjoyed getting to talk with you today! I am available on Tuesdays and Thursdays for virtual visits if you have any questions or concerns, or if I can be of any further assistance.   CHECKLIST FROM ANNUAL WELLNESS VISIT:  -Follow up (please call to schedule if not scheduled after visit):   -yearly for annual wellness visit with primary care office  Here is a list of your preventive care/health maintenance measures and the plan for each if any are due:  PLAN For any measures below that may be due:  -if able, please bring a copy of your cologuard report to Dr. Fabian Sharp to update -can get pneumonia vaccine at pharmacy or at the office -can get covid boosters at the pharmacy  Health Maintenance  Topic Date Due   Pneumonia Vaccine 46+ Years old (2 of 2 - PPSV23 or PCV20) 08/23/2021   Colonoscopy  10/15/2021   COVID-19 Vaccine (4 - 2023-24 season) 04/21/2022   INFLUENZA VACCINE  03/22/2023   MAMMOGRAM  10/20/2023   Medicare Annual Wellness (AWV)  02/29/2024   DTaP/Tdap/Td (5 - Td or Tdap) 02/18/2031   DEXA SCAN  Completed   Hepatitis C Screening  Completed   Zoster Vaccines- Shingrix  Completed   HPV VACCINES  Aged Out    -See a dentist at least yearly  -Get your eyes checked and then per your eye specialist's recommendations  -Other issues addressed today:   -I have included below further information regarding a healthy whole foods based diet, physical activity guidelines for adults, stress management and opportunities for social connections. I hope you find this information useful.   -----------------------------------------------------------------------------------------------------------------------------------------------------------------------------------------------------------------------------------------------------------  NUTRITION: -eat real food: lots of colorful vegetables (half the plate) and fruits -5-7 servings of vegetables and fruits per day (fresh or  steamed is best), exp. 2 servings of vegetables with lunch and dinner and 2 servings of fruit per day. Berries and greens such as kale and collards are great choices.  -consume on a regular basis: whole grains (make sure first ingredient on label contains the word "whole"), fresh fruits, fish, nuts, seeds, healthy oils (such as olive oil, avocado oil, grape seed oil) -may eat small amounts of dairy and lean meat on occasion, but avoid processed meats such as ham, bacon, lunch meat, etc. -drink water -try to avoid fast food and pre-packaged foods, processed meat -most experts advise limiting sodium to < 2300mg  per day, should limit further is any chronic conditions such as high blood pressure, heart disease, diabetes, etc. The American Heart Association advised that < 1500mg  is is ideal -try to avoid foods that contain any ingredients with names you do not recognize  -try to avoid sugar/sweets (except for the natural sugar that occurs in fresh fruit) -try to avoid sweet drinks -try to avoid white rice, white bread, pasta (unless whole grain), white or yellow potatoes  EXERCISE GUIDELINES FOR ADULTS: -if you wish to increase your physical activity, do so gradually and with the approval of your doctor -STOP and seek medical care immediately if you have any chest pain, chest discomfort or trouble breathing when starting or increasing exercise  -move and stretch your body, legs, feet and arms when sitting for long periods -Physical activity guidelines for optimal health in adults: -least 150 minutes per week of aerobic exercise (can talk, but not sing) once approved by your doctor, 20-30 minutes of sustained activity or two 10 minute episodes of sustained activity every day.  -resistance training at least 2 days per  week if approved by your doctor -balance exercises 3+ days per week:   Stand somewhere where you have something sturdy to hold onto if you lose balance.    1) lift up on toes, start with  5x per day and work up to 20x   2) stand and lift on leg straight out to the side so that foot is a few inches of the floor, start with 5x each side and work up to 20x each side   3) stand on one foot, start with 5 seconds each side and work up to 20 seconds on each side  If you need ideas or help with getting more active:  -Silver sneakers https://tools.silversneakers.com  -Walk with a Doc: http://www.duncan-williams.com/  -try to include resistance (weight lifting/strength building) and balance exercises twice per week: or the following link for ideas: http://castillo-powell.com/  BuyDucts.dk  STRESS MANAGEMENT: -can try meditating, or just sitting quietly with deep breathing while intentionally relaxing all parts of your body for 5 minutes daily -if you need further help with stress, anxiety or depression please follow up with your primary doctor or contact the wonderful folks at [facility]: [phone]  SOCIAL CONNECTIONS: -options in [place] if you wish to engage in more social and exercise related activities:  -Silver sneakers https://tools.silversneakers.com  -Walk with a Doc: http://www.duncan-williams.com/  -Check out the [facility] Active Adults 50+ section on the [place] of [employer] (hiking clubs, book clubs, cards and games, chess, exercise classes, aquatic classes and much more) - see the website for details: https://www.[place].gov/departments/parks-recreation/active-adults50  -YouTube has lots of exercise videos for different ages and abilities as well  -[facility] (a variety of indoor and outdoor inperson activities for adults). (563)529-2836. [address]  -Virtual Online Classes (a variety of topics): see seniorplanet.org or call [phone]  -consider volunteering at a school, hospice center, church, senior center or  elsewhere

## 2023-03-01 NOTE — Progress Notes (Signed)
PATIENT CHECK-IN and HEALTH RISK ASSESSMENT QUESTIONNAIRE:  -completed by phone/video for upcoming Medicare Preventive Visit  Pre-Visit Check-in: 1)Vitals (height, wt, BP, etc) - record in vitals section for visit on day of visit 2)Review and Update Medications, Allergies PMH, Surgeries, Social history in Epic 3)Hospitalizations in the last year with date/reason? none 4)Review and Update Care Team (patient's specialists) in Epic 5) Complete PHQ9 in Epic  6) Complete Fall Screening in Epic 7)Review all Health Maintenance Due and order under PCP if not done.  8)Medicare Wellness Questionnaire: Answer theses question about your habits: Do you drink alcohol? Rare, less than 1 drink per week Have you ever smoked? Yes over 30 years ago Do you use an illicit drugs? no Do you exercises? Exercising at the gym 3 days per week - some walking and some weights Are you sexually active? Yes, 1 partner, no concerns for STIs Typical breakfast: coffee, greek yogurt, granola, egg Typical lunch: sometimes skips Typical dinner: chicken and fish, admits does not eat enough salads/veggies Typical snacks: some snacks, fruit, pretzels, chips  Beverages: water, coffee, sugar free sparkling waters  Answer theses question about you: Can you perform most household chores? yes Do you find it hard to follow a conversation in a noisy room?no Do you often ask people to speak up or repeat themselves?no Do you feel that you have a problem with memory?no Do you balance your checkbook and or bank acounts? yes Do you feel safe at home? yes Last dentist visit?  Every 6 months Do you need assistance with any of the following: Please note if so no concerns  Driving?  Feeding yourself?  Getting from bed to chair?  Getting to the toilet?  Bathing or showering?  Dressing yourself?  Managing money?  Climbing a flight of stairs  Preparing meals?  Do you have Advanced Directives in place (Living Will, Healthcare Power  or Attorney)? Yes   Last eye Exam and location? Every year, January, Dr. Randon Goldsmith   Do you currently use prescribed or non-prescribed narcotic or opioid pain medications?  none  Do you have a history or close family history of breast, ovarian, tubal or peritoneal cancer or a family member with BRCA (breast cancer susceptibility 1 and 2) gene mutations? none  Nurse/Assistant Credentials/time stamp:   ----------------------------------------------------------------------------------------------------------------------------------------------------------------------------------------------------------------------   MEDICARE ANNUAL PREVENTIVE VISIT WITH PROVIDER: (Welcome to [employer], initial annual wellness or annual wellness exam)  Virtual Visit via Phone Note  I connected with Katherine Rodriguez on 03/01/23 by phone and verified that I am speaking with the correct person using two identifiers.  Location patient: home Location provider:work or home office Persons participating in the virtual visit: patient, provider  Concerns and/or follow up today: none, other than wanted to review bone density. Taking fosamax. Doing exercise. Taking vit D.    See HM section in Epic for other details of completed HM.    ROS: negative for report of fevers, unintentional weight loss, vision changes, vision loss, hearing loss or change, chest pain, sob, hemoptysis, melena, hematochezia, hematuria, falls, bleeding or bruising, thoughts of suicide or self harm, memory loss  Patient-completed extensive health risk assessment - reviewed and discussed with the patient: See Health Risk Assessment completed with patient prior to the visit either above or in recent phone note. This was reviewed in detailed with the patient today and appropriate recommendations, orders and referrals were placed as needed per Summary below and patient instructions.   Review of Medical History: -PMH, PSH, Family History and current  specialty and care providers reviewed and updated and listed below   Patient Care Team: Panosh, Neta Mends, MD as PCP - General Lynden Ang, NP as Nurse Practitioner (Obstetrics and Gynecology) Ollen Gross, MD as Consulting Physician (Orthopedic Surgery) Bufford Buttner, MD as Referring Physician (Dermatology)   Past Medical History:  Diagnosis Date   Abnormal Pap smear of anus    Anemia 2010   hx of   Arthritis    Basal cell carcinoma    leg 2020   Endometriosis    GERD (gastroesophageal reflux disease) 2012   hx of, none recent   History of blood transfusion 2010   no reaction   History of herpes labialis 08/01/2011   Periumbilical abdominal pain 03/11/2013   doubt uti but cx pending consdier small umbi hernia . refer  options of eval discussed     Pneumonia 1994   hx of   PPD positive    Recurrent HSV (herpes simplex virus)    S/P hip replacement 08/01/2011    Past Surgical History:  Procedure Laterality Date   JOINT REPLACEMENT Right 2010   hip   LAPAROTOMY  1980's, 1991   exploratory, laser for endometriosis   THYROIDECTOMY, PARTIAL  1973   TOTAL HIP ARTHROPLASTY Left 11/05/2013   Procedure: LEFT TOTAL HIP ARTHROPLASTY ANTERIOR APPROACH;  Surgeon: Loanne Drilling, MD;  Location: [facility];  Service: Orthopedics;  Laterality: Left;    Social History   Socioeconomic History   Marital status: Married    Spouse name: Not on file   Number of children: Not on file   Years of education: Not on file   Highest education level: Not on file  Occupational History   Not on file  Tobacco Use   Smoking status: Former    Current packs/day: 0.00    Types: Cigarettes    Start date: 08/22/1971    Quit date: 08/21/1986    Years since quitting: 36.5   Smokeless tobacco: Never  Vaping Use   Vaping status: Never Used  Substance and Sexual Activity   Alcohol use: Yes    Comment: rare   Drug use: No   Sexual activity: Yes  Other Topics Concern   Not on file  Social  History Narrative   Married   Regular exercise- yes   Cutting back on caffeine   Sleep 7    HH 3 pet dog and cat.    Works hospital nursing   G4 P1    Social Determinants of Health   Financial Resource Strain: Low Risk  (02/27/2022)   Overall Financial Resource Strain (CARDIA)    Difficulty of Paying Living Expenses: Not hard at all  Food Insecurity: No Food Insecurity (02/27/2022)   Hunger Vital Sign    Worried About Running Out of Food in the Last Year: Never true    Ran Out of Food in the Last Year: Never true  Transportation Needs: No Transportation Needs (02/27/2022)   PRAPARE - Administrator, Civil Service (Medical): No    Lack of Transportation (Non-Medical): No  Physical Activity: Sufficiently Active (02/27/2022)   Exercise Vital Sign    Days of Exercise per Week: 3 days    Minutes of Exercise per Session: 60 min  Stress: No Stress Concern Present (02/27/2022)   [employer] of Occupational Health - Occupational Stress Questionnaire    Feeling of Stress : Not at all  Social Connections: Moderately Isolated (02/10/2021)   Social Connection and Isolation Panel [NHANES]  Frequency of Communication with Friends and Family: Three times a week    Frequency of Social Gatherings with Friends and Family: Three times a week    Attends Religious Services: Never    Active Member of Clubs or Organizations: No    Attends Banker Meetings: Never    Marital Status: Married  Catering manager Violence: Not At Risk (02/10/2021)   Humiliation, Afraid, Rape, and Kick questionnaire    Fear of Current or Ex-Partner: No    Emotionally Abused: No    Physically Abused: No    Sexually Abused: No    Family History  Problem Relation Age of Onset   Hypertension Mother    Arthritis Mother        spinal stenosis   Lymphoma Other    Other Other        bowel disease   Autoimmune disease Sister        cold agglutinin hemolytic anemia presumed autoimmune    Breast  cancer Neg Hx     Current Outpatient Medications on File Prior to Visit  Medication Sig Dispense Refill   alendronate (FOSAMAX) 70 MG tablet Take 1 tablet (70 mg total) by mouth once a week. Take with a full glass of water on an empty stomach. 12 tablet 1   Cholecalciferol (VITAMIN D3) 5000 UNITS CAPS Take by mouth. 1 CAPSULE THREE TIMES WEEKLY     influenza vaccine adjuvanted (FLUAD) 0.5 ML injection Inject into the muscle. 0.5 mL 0   loratadine (CLARITIN) 10 MG tablet Take 10 mg daily by mouth.     MYRBETRIQ 50 MG TB24 tablet Take 50 mg by mouth daily. Takes as needed     naproxen sodium (ALEVE) 220 MG tablet      polyethylene glycol powder (GLYCOLAX/MIRALAX) 17 GM/SCOOP powder      Tdap (BOOSTRIX) 5-2.5-18.5 LF-MCG/0.5 injection Inject into the muscle. 0.5 mL 0   valACYclovir (VALTREX) 1000 MG tablet TAKE 2 TABLETS BY MOUTH TWICE A DAY AS NEEDED FOR FEVER BLISTERS 30 tablet 5   vitamin C (ASCORBIC ACID) 500 MG tablet Take 500 mg three times weekly     Zoster Vaccine Adjuvanted [facility]) injection Inject into the muscle. 0.5 mL 0   Zoster Vaccine Adjuvanted [facility]) injection Inject into the muscle. 0.5 mL 0   No current facility-administered medications on file prior to visit.    No Known Allergies     Physical Exam There were no vitals filed for this visit. Estimated body mass index is 25.63 kg/m as calculated from the following:   Height as of 08/10/22: 5' 3.5" (1.613 m).   Weight as of 08/10/22: 147 lb (66.7 kg).  EKG (optional): deferred due to virtual visit  GENERAL: alert, oriented, no acute distress detected, full vision exam deferred due to pandemic and/or virtual encounter  PSYCH/NEURO: pleasant and cooperative, no obvious depression or anxiety, speech and thought processing grossly intact, Cognitive function grossly intact  Flowsheet Row Office Visit from [facility]/10/2022 [facility]  PHQ-9 Total Score 2           03/01/2023    10:16 AM 08/10/2022    2:14 PM 02/27/2022    1:50 PM 02/10/2021    9:21 AM 08/23/2020    1:23 PM  Depression screen PHQ 2/9  Decreased Interest 0 0 0 0 0  Down, Depressed, Hopeless 0 0 0 0 0  PHQ - 2 Score 0 0 0 0 0  Altered sleeping  1  Tired, decreased energy  0     Change in appetite  0     Feeling bad or failure about yourself   0     Trouble concentrating  1     Moving slowly or fidgety/restless  0     Suicidal thoughts  0     PHQ-9 Score  2     Difficult doing work/chores  Not difficult at all          08/23/2020    1:23 PM 02/10/2021    9:21 AM 02/27/2022    1:50 PM 08/10/2022    2:14 PM 03/01/2023   10:16 AM  Fall Risk  Falls in the past year? 0 0 0 0 0  Was there an injury with Fall? 0 0 0 0 0  Fall Risk Category Calculator 0 0 0 0 0  Fall Risk Category (Retired) Low Low Low Low   (RETIRED) Patient Fall Risk Level Low fall risk Low fall risk Low fall risk Low fall risk   Patient at Risk for Falls Due to   Medication side effect No Fall Risks No Fall Risks  Fall risk Follow up  Falls evaluation completed Falls evaluation completed;Education provided;Falls prevention discussed Falls evaluation completed      SUMMARY AND PLAN:  Encounter for Medicare annual wellness exam  Discussed applicable health maintenance/preventive health measures and advised and referred or ordered per patient preferences: -had [facility] test [facility] - negative per patient, she agrees to get copy for Dr. Fabian Sharp -reviewed bone density results and discussed a number of bone building exercises, balance exercises, fall prevention, nutrition for healthy bones, vits -reports she has discussed medication options for osteoporosis and is considering change - but for now wants to stick with fosamax -discussed recommendations for vaccines due and how to obtain  Health Maintenance  Topic Date Due   Pneumonia Vaccine 28+ Years old (2 of 2 - PPSV23 or PCV20) 08/23/2021   Colonoscopy   10/15/2021   COVID-19 Vaccine (4 - 2023-24 season) 04/21/2022   INFLUENZA VACCINE  03/22/2023   MAMMOGRAM  10/20/2023   Medicare Annual Wellness (AWV)  02/29/2024   DTaP/Tdap/Td (5 - Td or Tdap) 02/18/2031   DEXA SCAN  Completed   Hepatitis C Screening  Completed   Zoster Vaccines- Shingrix  Completed   HPV VACCINES  Aged [employer] and counseling on the following was provided based on the above review of health and a plan/checklist for the patient, along with additional information discussed, was provided for the patient in the patient instructions :  -Provided  safe balance exercises that can be done at home to improve balance and discussed exercise guidelines for adults with include balance exercises at least 3 days per week.  -Advised and counseled on a healthy lifestyle - including the importance of a healthy diet, regular physical activity, social connections and stress management. -Reviewed patient's current diet. Advised and counseled on a whole foods based healthy diet. A summary of a healthy diet was provided in the Patient Instructions.  -reviewed patient's current physical activity level and discussed exercise guidelines for adults. Discussed community resources and ideas for safe exercise at home to assist in meeting exercise guideline recommendations in a safe and healthy way.  -Advise yearly dental visits at minimum and regular eye exams -Advised and counseled on alcohol safe limits, risks  Follow up: see patient instructions     Patient Instructions  I really enjoyed getting to talk  with you today! I am available on Tuesdays and Thursdays for virtual visits if you have any questions or concerns, or if I can be of any further assistance.   CHECKLIST FROM ANNUAL WELLNESS VISIT:  -Follow up (please call to schedule if not scheduled after visit):   -yearly for annual wellness visit with primary care office  Here is a list of your preventive care/health maintenance  measures and the plan for each if any are due:  PLAN For any measures below that may be due:  -if able, please bring a copy of your cologuard report to Dr. Fabian Sharp to update -can get pneumonia vaccine at pharmacy or at the office -can get covid boosters at the pharmacy  Health Maintenance  Topic Date Due   Pneumonia Vaccine 65+ Years old (2 of 2 - PPSV23 or PCV20) 08/23/2021   Colonoscopy  10/15/2021   COVID-19 Vaccine (4 - 2023-24 season) 04/21/2022   INFLUENZA VACCINE  03/22/2023   MAMMOGRAM  10/20/2023   Medicare Annual Wellness (AWV)  02/29/2024   DTaP/Tdap/Td (5 - Td or Tdap) 02/18/2031   DEXA SCAN  Completed   Hepatitis C Screening  Completed   Zoster Vaccines- Shingrix  Completed   HPV VACCINES  Aged Out    -See a dentist at least yearly  -Get your eyes checked and then per your eye specialist's recommendations  -Other issues addressed today:   -I have included below further information regarding a healthy whole foods based diet, physical activity guidelines for adults, stress management and opportunities for social connections. I hope you find this information useful.   -----------------------------------------------------------------------------------------------------------------------------------------------------------------------------------------------------------------------------------------------------------  NUTRITION: -eat real food: lots of colorful vegetables (half the plate) and fruits -5-7 servings of vegetables and fruits per day (fresh or steamed is best), exp. 2 servings of vegetables with lunch and dinner and 2 servings of fruit per day. Berries and greens such as kale and collards are great choices.  -consume on a regular basis: whole grains (make sure first ingredient on label contains the word "whole"), fresh fruits, fish, nuts, seeds, healthy oils (such as olive oil, avocado oil, grape seed oil) -may eat small amounts of dairy and lean meat on  occasion, but avoid processed meats such as ham, bacon, lunch meat, etc. -drink water -try to avoid fast food and pre-packaged foods, processed meat -most experts advise limiting sodium to < 2300mg  per day, should limit further is any chronic conditions such as high blood pressure, heart disease, diabetes, etc. The American Heart Association advised that < 1500mg  is is ideal -try to avoid foods that contain any ingredients with names you do not recognize  -try to avoid sugar/sweets (except for the natural sugar that occurs in fresh fruit) -try to avoid sweet drinks -try to avoid white rice, white bread, pasta (unless whole grain), white or yellow potatoes  EXERCISE GUIDELINES FOR ADULTS: -if you wish to increase your physical activity, do so gradually and with the approval of your doctor -STOP and seek medical care immediately if you have any chest pain, chest discomfort or trouble breathing when starting or increasing exercise  -move and stretch your body, legs, feet and arms when sitting for long periods -Physical activity guidelines for optimal health in adults: -least 150 minutes per week of aerobic exercise (can talk, but not sing) once approved by your doctor, 20-30 minutes of sustained activity or two 10 minute episodes of sustained activity every day.  -resistance training at least 2 days per week if approved by your  doctor -balance exercises 3+ days per week:   Stand somewhere where you have something sturdy to hold onto if you lose balance.    1) lift up on toes, start with 5x per day and work up to 20x   2) stand and lift on leg straight out to the side so that foot is a few inches of the floor, start with 5x each side and work up to 20x each side   3) stand on one foot, start with 5 seconds each side and work up to 20 seconds on each side  If you need ideas or help with getting more active:  -Silver sneakers https://tools.silversneakers.com  -Walk with a  Doc: http://www.duncan-williams.com/  -try to include resistance (weight lifting/strength building) and balance exercises twice per week: or the following link for ideas: http://castillo-powell.com/  BuyDucts.dk  STRESS MANAGEMENT: -can try meditating, or just sitting quietly with deep breathing while intentionally relaxing all parts of your body for 5 minutes daily -if you need further help with stress, anxiety or depression please follow up with your primary doctor or contact the wonderful folks at [facility]: [phone]  SOCIAL CONNECTIONS: -options in [place] if you wish to engage in more social and exercise related activities:  -Silver sneakers https://tools.silversneakers.com  -Walk with a Doc: http://www.duncan-williams.com/  -Check out the [facility] Active Adults 50+ section on the [place] of [employer] (hiking clubs, book clubs, cards and games, chess, exercise classes, aquatic classes and much more) - see the website for details: https://www.[place].gov/departments/parks-recreation/active-adults50  -YouTube has lots of exercise videos for different ages and abilities as well  -[facility] (a variety of indoor and outdoor inperson activities for adults). 660-304-7006. [address]  -Virtual Online Classes (a variety of topics): see seniorplanet.org or call 8315006766  -consider volunteering at a school, hospice center, church, senior center or elsewhere           Terressa Koyanagi, DO

## 2023-03-08 NOTE — Progress Notes (Signed)
Negative cologuard  can repeat in 3 years as indicated

## 2023-03-12 ENCOUNTER — Other Ambulatory Visit: Payer: Self-pay | Admitting: Internal Medicine

## 2023-06-13 ENCOUNTER — Other Ambulatory Visit (HOSPITAL_BASED_OUTPATIENT_CLINIC_OR_DEPARTMENT_OTHER): Payer: Self-pay

## 2023-06-13 MED ORDER — INFLUENZA VAC A&B SURF ANT ADJ 0.5 ML IM SUSY
0.5000 mL | PREFILLED_SYRINGE | Freq: Once | INTRAMUSCULAR | 0 refills | Status: AC
  Filled 2023-06-13: qty 0.5, 1d supply, fill #0

## 2023-07-20 ENCOUNTER — Other Ambulatory Visit (HOSPITAL_BASED_OUTPATIENT_CLINIC_OR_DEPARTMENT_OTHER): Payer: Self-pay

## 2023-07-20 MED ORDER — PREVNAR 20 0.5 ML IM SUSY
0.5000 mL | PREFILLED_SYRINGE | Freq: Once | INTRAMUSCULAR | 0 refills | Status: AC
  Filled 2023-07-20: qty 0.5, 1d supply, fill #0

## 2023-08-25 ENCOUNTER — Other Ambulatory Visit: Payer: Self-pay | Admitting: Internal Medicine

## 2023-09-13 ENCOUNTER — Other Ambulatory Visit: Payer: Self-pay | Admitting: Internal Medicine

## 2023-09-13 DIAGNOSIS — Z1231 Encounter for screening mammogram for malignant neoplasm of breast: Secondary | ICD-10-CM

## 2023-10-22 ENCOUNTER — Ambulatory Visit
Admission: RE | Admit: 2023-10-22 | Discharge: 2023-10-22 | Disposition: A | Discharge status: Home / Self Care | Payer: Medicare Other | Source: Ambulatory Visit | Attending: Internal Medicine | Admitting: Internal Medicine

## 2023-10-22 DIAGNOSIS — Z1231 Encounter for screening mammogram for malignant neoplasm of breast: Secondary | ICD-10-CM

## 2023-11-29 ENCOUNTER — Other Ambulatory Visit: Payer: Self-pay | Admitting: Internal Medicine

## 2023-12-04 ENCOUNTER — Telehealth: Admitting: Family Medicine

## 2023-12-10 NOTE — Progress Notes (Signed)
 Chief Complaint  Patient presents with   Medical Management of Chronic Issues   Medication Refill    HPI: Katherine Rodriguez 68 y.o. come in for Chronic disease management  Last visit with me was 6 2022  for osteoporosis  on fosomax  also needs refill  valtrex  as prn med  Under eval for post menopauseal bleeding has had gyne fu. Doing well .   Fosamax  since [age]   both parents had jip fx  ages 88 and 33  Exercise  regular  resistance and walking  Last dexa 2024  GI  sees [facility] :   Linzesse  was helpful  more than miralax   and continues   [facility] t, Has done cologuard negative .  Takes vit d equivalent of 16109 u  per week.  Bp usually good 120 range at home  ROS: See pertinent positives and negatives per HPI.  Past Medical History:  Diagnosis Date   Abnormal Pap smear of anus    Anemia 2010   hx of   Arthritis    Basal cell carcinoma    leg 2020   Endometriosis    GERD (gastroesophageal reflux disease) 2012   hx of, none recent   History of blood transfusion 2010   no reaction   History of herpes labialis 08/01/2011   Periumbilical abdominal pain 03/11/2013   doubt uti but cx pending consdier small umbi hernia . refer  options of eval discussed     Pneumonia 1994   hx of   PPD positive    Recurrent HSV (herpes simplex virus)    S/P hip replacement 08/01/2011    Family History  Problem Relation Age of Onset   Hypertension Mother    Arthritis Mother        spinal stenosis   Lymphoma Other    Other Other        bowel disease   Autoimmune disease Sister        cold agglutinin hemolytic anemia presumed autoimmune    Breast cancer Neg Hx     Social History   Socioeconomic History   Marital status: Married    Spouse name: Not on file   Number of children: Not on file   Years of education: Not on file   Highest education level: Not on file  Occupational History   Not on file  Tobacco Use   Smoking status: Former    Current packs/day: 0.00    Types: Cigarettes     Start date: 08/22/1971    Quit date: 08/21/1986    Years since quitting: 37.3   Smokeless tobacco: Never  Vaping Use   Vaping status: Never Used  Substance and Sexual Activity   Alcohol use: Yes    Comment: rare   Drug use: No   Sexual activity: Yes  Other Topics Concern   Not on file  Social History Narrative   Married   Regular exercise- yes   Cutting back on caffeine   Sleep 7    HH 3 pet dog and cat.    Works hospital nursing   G4 P1    Social Drivers of Corporate investment banker Strain: Low Risk  (02/27/2022)   Overall Financial Resource Strain (CARDIA)    Difficulty of Paying Living Expenses: Not hard at all  Food Insecurity: No Food Insecurity (02/27/2022)   Hunger Vital Sign    Worried About Running Out of Food in the Last Year: Never true  Ran Out of Food in the Last Year: Never true  Transportation Needs: No Transportation Needs (02/27/2022)   PRAPARE - Administrator, Civil Service (Medical): No    Lack of Transportation (Non-Medical): No  Physical Activity: Sufficiently Active (02/27/2022)   Exercise Vital Sign    Days of Exercise per Week: 3 days    Minutes of Exercise per Session: 60 min  Stress: No Stress Concern Present (02/27/2022)   [employer] of Occupational Health - Occupational Stress Questionnaire    Feeling of Stress : Not at all  Social Connections: Moderately Isolated (02/10/2021)   Social Connection and Isolation Panel [NHANES]    Frequency of Communication with Friends and Family: Three times a week    Frequency of Social Gatherings with Friends and Family: Three times a week    Attends Religious Services: Never    Active Member of Clubs or Organizations: No    Attends Banker Meetings: Never    Marital Status: Married    Outpatient Medications Prior to Visit  Medication Sig Dispense Refill   alendronate  (FOSAMAX ) 70 MG tablet TAKE 1 TABLET (70 MG TOTAL) BY MOUTH ONCE A WEEK. TAKE WITH A FULL GLASS OF WATER   ON AN EMPTY STOMACH. 12 tablet 1   azelastine (ASTELIN) 0.1 % nasal spray Place 1 spray into both nostrils daily. Use in each nostril as directed     Cholecalciferol (VITAMIN D3) 5000 UNITS CAPS Take by mouth. 1 CAPSULE THREE TIMES WEEKLY     Fiber Adult Gummies 2 g CHEW 1 gummy Orally twice a day     LINZESS 72 MCG capsule Take 72 mcg by mouth daily.     loratadine (CLARITIN) 10 MG tablet Take 10 mg daily by mouth.     MYRBETRIQ 50 MG TB24 tablet Take 50 mg by mouth daily. Takes as needed     naproxen sodium (ALEVE) 220 MG tablet      vitamin C (ASCORBIC ACID) 500 MG tablet Take 500 mg three times weekly     valACYclovir  (VALTREX ) 1000 MG tablet TAKE 2 TABLETS BY MOUTH TWICE A DAY AS NEEDED FOR FEVER BLISTERS 30 tablet 5   influenza vaccine adjuvanted (FLUAD) 0.5 ML injection Inject into the muscle. 0.5 mL 0   polyethylene glycol powder (GLYCOLAX /MIRALAX ) 17 GM/SCOOP powder  (Patient not taking: Reported on 12/11/2023)     Tdap (BOOSTRIX ) 5-2.5-18.5 LF-MCG/0.5 injection Inject into the muscle. 0.5 mL 0   Zoster Vaccine Adjuvanted (SHINGRIX ) injection Inject into the muscle. 0.5 mL 0   Zoster Vaccine Adjuvanted (SHINGRIX ) injection Inject into the muscle. 0.5 mL 0   No facility-administered medications prior to visit.     EXAM:  BP (!) 130/90 (BP Location: Left Arm, Patient Position: Sitting, Cuff Size: Normal)   Pulse 65   Temp 98 F (36.7 C) (Oral)   Ht 5' 3.5" (1.613 m)   Wt 153 lb 6.4 oz (69.6 kg)   SpO2 97%   BMI 26.75 kg/m   Body mass index is 26.75 kg/m.  GENERAL: vitals reviewed and listed above, alert, oriented, appears well hydrated and in no acute distress HEENT: atraumatic, conjunctiva  clear, no obvious abnormalities on inspection of external nose and ears NECK: no obvious masses on inspection palpation  LUNGS: clear to auscultation bilaterally, no wheezes, rales or rhonchi, good air movement CV: HRRR, no clubbing cyanosis or  peripheral edema nl cap refill  MS:  moves all extremities without noticeable focal  abnormality PSYCH: pleasant and  cooperative, no obvious depression or anxiety Lab Results  Component Value Date   WBC 6.0 08/10/2022   HGB 13.8 08/10/2022   HCT 41.5 08/10/2022   PLT 245.0 08/10/2022   GLUCOSE 90 08/10/2022   CHOL 183 08/10/2022   TRIG 67.0 08/10/2022   HDL 69.20 08/10/2022   LDLCALC 100 (H) 08/10/2022   ALT 25 08/10/2022   AST 21 08/10/2022   NA 140 08/10/2022   K 4.7 08/10/2022   CL 103 08/10/2022   CREATININE 0.77 08/10/2022   BUN 10 08/10/2022   CO2 32 08/10/2022   TSH 1.64 08/10/2022   INR 0.95 10/28/2013   BP Readings from Last 3 Encounters:  12/11/23 (!) 130/90  08/10/22 118/68  02/10/21 110/62  Last vitamin D  Lab Results  Component Value Date   VD25OH 43.69 08/10/2022   ASSESSMENT: The BMD measured at Forearm Radius 33% is 0.600 g/cm2 with a T-score of -3.1. This patient is considered osteoporotic according to World Health Organization [facility]) criteria.   The quality of the exam is good. Both hips excluded due to surgical hardware. L4 was excluded due to degenerative changes. Site Region Measured Date Measured Age YA BMD Significant CHANGE T-score Left Forearm Radius 33% 02/01/2023 68.5 -3.1 0.600 g/cm2 Left Forearm Radius 33% 12/22/2020 66.4 -3.0 0.623 g/cm2   AP Spine L1-L3 02/01/2023 68.5 -[age] 0.933 g/cm2 * AP Spine L1-L3 12/22/2020 66.4 -2.5 0.872 g/cm2  ASSESSMENT AND PLAN:  Discussed the following assessment and plan:  Osteoporosis without current pathological fracture, unspecified osteoporosis type  Vitamin D  deficiency  Medication management Refill valtrex    Plan update labs can make appt before visit   yearly  future orders placed  -Patient advised to return or notify health care team  if  new concerns arise.  Patient Instructions  Plan  lab  can make appt   for lab before visit .  Will check vit D level .   Check bp at home to make sure  at goal.  1.2 gram/ kg / day   protein    Ilyanna Baillargeon K. Zsazsa Bahena M.D.

## 2023-12-11 ENCOUNTER — Other Ambulatory Visit: Payer: Self-pay | Admitting: Internal Medicine

## 2023-12-11 ENCOUNTER — Encounter: Payer: Self-pay | Admitting: Internal Medicine

## 2023-12-11 ENCOUNTER — Other Ambulatory Visit (HOSPITAL_BASED_OUTPATIENT_CLINIC_OR_DEPARTMENT_OTHER): Payer: Self-pay

## 2023-12-11 ENCOUNTER — Ambulatory Visit (INDEPENDENT_AMBULATORY_CARE_PROVIDER_SITE_OTHER): Admitting: Internal Medicine

## 2023-12-11 VITALS — BP 130/90 | HR 65 | Temp 98.0°F | Ht 63.5 in | Wt 153.4 lb

## 2023-12-11 DIAGNOSIS — Z79899 Other long term (current) drug therapy: Secondary | ICD-10-CM | POA: Diagnosis not present

## 2023-12-11 DIAGNOSIS — M810 Age-related osteoporosis without current pathological fracture: Secondary | ICD-10-CM | POA: Diagnosis not present

## 2023-12-11 DIAGNOSIS — E559 Vitamin D deficiency, unspecified: Secondary | ICD-10-CM

## 2023-12-11 DIAGNOSIS — E782 Mixed hyperlipidemia: Secondary | ICD-10-CM

## 2023-12-11 MED ORDER — VALACYCLOVIR HCL 1 G PO TABS
2000.0000 mg | ORAL_TABLET | Freq: Two times a day (BID) | ORAL | 5 refills | Status: AC | PRN
  Filled 2023-12-11: qty 30, 8d supply, fill #0
  Filled 2024-04-22: qty 30, 8d supply, fill #1
  Filled 2024-06-19: qty 30, 8d supply, fill #2
  Filled 2024-09-25: qty 30, 8d supply, fill #3

## 2023-12-11 NOTE — Progress Notes (Signed)
 Future orders  pre visit for cpe yearly visit

## 2023-12-11 NOTE — Patient Instructions (Addendum)
 Plan  lab  can make appt   for lab before visit .  Will check vit D level .   Check bp at home to make sure  at goal.  1.2 gram/ kg / day  protein

## 2023-12-12 ENCOUNTER — Other Ambulatory Visit (HOSPITAL_BASED_OUTPATIENT_CLINIC_OR_DEPARTMENT_OTHER): Payer: Self-pay

## 2023-12-17 ENCOUNTER — Other Ambulatory Visit

## 2023-12-19 ENCOUNTER — Encounter: Payer: Self-pay | Admitting: Internal Medicine

## 2023-12-19 ENCOUNTER — Other Ambulatory Visit (INDEPENDENT_AMBULATORY_CARE_PROVIDER_SITE_OTHER)

## 2023-12-19 DIAGNOSIS — E559 Vitamin D deficiency, unspecified: Secondary | ICD-10-CM | POA: Diagnosis not present

## 2023-12-19 DIAGNOSIS — E782 Mixed hyperlipidemia: Secondary | ICD-10-CM

## 2023-12-19 DIAGNOSIS — M810 Age-related osteoporosis without current pathological fracture: Secondary | ICD-10-CM

## 2023-12-19 DIAGNOSIS — Z79899 Other long term (current) drug therapy: Secondary | ICD-10-CM | POA: Diagnosis not present

## 2023-12-19 LAB — CBC WITH DIFFERENTIAL/PLATELET
Basophils Absolute: 0 10*3/uL (ref 0.0–0.1)
Basophils Relative: 0.8 % (ref 0.0–3.0)
Eosinophils Absolute: 0.1 10*3/uL (ref 0.0–0.7)
Eosinophils Relative: 2.2 % (ref 0.0–5.0)
HCT: 40.6 % (ref 36.0–46.0)
Hemoglobin: 13.3 g/dL (ref 12.0–15.0)
Lymphocytes Relative: 36.4 % (ref 12.0–46.0)
Lymphs Abs: 1.6 10*3/uL (ref 0.7–4.0)
MCHC: 32.8 g/dL (ref 30.0–36.0)
MCV: 92.7 fl (ref 78.0–100.0)
Monocytes Absolute: 0.4 10*3/uL (ref 0.1–1.0)
Monocytes Relative: 10 % (ref 3.0–12.0)
Neutro Abs: 2.3 10*3/uL (ref 1.4–7.7)
Neutrophils Relative %: 50.6 % (ref 43.0–77.0)
Platelets: 210 10*3/uL (ref 150.0–400.0)
RBC: 4.38 Mil/uL (ref 3.87–5.11)
RDW: 12.7 % (ref 11.5–15.5)
WBC: 4.5 10*3/uL (ref 4.0–10.5)

## 2023-12-19 LAB — BASIC METABOLIC PANEL WITH GFR
BUN: 15 mg/dL (ref 6–23)
CO2: 30 meq/L (ref 19–32)
Calcium: 8.7 mg/dL (ref 8.4–10.5)
Chloride: 104 meq/L (ref 96–112)
Creatinine, Ser: 0.8 mg/dL (ref 0.40–1.20)
GFR: 75.19 mL/min (ref >60.00)
Glucose, Bld: 96 mg/dL (ref 70–99)
Potassium: 4.2 meq/L (ref 3.5–5.1)
Sodium: 139 meq/L (ref 135–145)

## 2023-12-19 LAB — LIPID PANEL
Cholesterol: 179 mg/dL (ref 0–200)
HDL: 66.3 mg/dL (ref >39.00)
LDL Cholesterol: 99 mg/dL (ref 0–99)
NonHDL: 112.3
Total CHOL/HDL Ratio: 3
Triglycerides: 66 mg/dL (ref 0.0–149.0)
VLDL: 13.2 mg/dL (ref 0.0–40.0)

## 2023-12-19 LAB — HEPATIC FUNCTION PANEL
ALT: 17 U/L (ref 0–35)
AST: 19 U/L (ref 0–37)
Albumin: 4.3 g/dL (ref 3.5–5.2)
Alkaline Phosphatase: 51 U/L (ref 39–117)
Bilirubin, Direct: 0.1 mg/dL (ref 0.0–0.3)
Total Bilirubin: 0.6 mg/dL (ref 0.2–1.2)
Total Protein: 6.6 g/dL (ref 6.0–8.3)

## 2023-12-19 LAB — TSH: TSH: 1.46 u[IU]/mL (ref 0.35–5.50)

## 2023-12-19 LAB — VITAMIN D 25 HYDROXY (VIT D DEFICIENCY, FRACTURES): VITD: 44.07 ng/mL (ref 30.00–100.00)

## 2023-12-19 LAB — HEMOGLOBIN A1C: Hgb A1c MFr Bld: 5.7 % (ref 4.6–6.5)

## 2023-12-19 NOTE — Progress Notes (Signed)
 Results in range  will review at upcoming visit

## 2023-12-24 NOTE — Progress Notes (Unsigned)
 No chief complaint on file.   HPI: Patient  Katherine Rodriguez  68 y.o. comes in today for Preventive Health Care visit   Health Maintenance  Topic Date Due   Colonoscopy  10/15/2021   COVID-19 Vaccine (4 - 2024-25 season) 04/22/2023   Medicare Annual Wellness (AWV)  02/29/2024   INFLUENZA VACCINE  03/21/2024   MAMMOGRAM  10/21/2024   DTaP/Tdap/Td (6 - Td or Tdap) 03/03/2031   Pneumonia Vaccine 15+ Years old  Completed   DEXA SCAN  Completed   Hepatitis C Screening  Completed   Zoster Vaccines- Shingrix   Completed   HPV VACCINES  Aged Out   Meningococcal B Vaccine  Aged Out   Health Maintenance Review LIFESTYLE:  Exercise:   Tobacco/ETS: Alcohol:  Sugar beverages: Sleep: Drug use: no HH of  Work:    ROS:  GEN/ HEENT: No fever, significant weight changes sweats headaches vision problems hearing changes, CV/ PULM; No chest pain shortness of breath cough, syncope,edema  change in exercise tolerance. GI /GU: No adominal pain, vomiting, change in bowel habits. No blood in the stool. No significant GU symptoms. SKIN/HEME: ,no acute skin rashes suspicious lesions or bleeding. No lymphadenopathy, nodules, masses.  NEURO/ PSYCH:  No neurologic signs such as weakness numbness. No depression anxiety. IMM/ Allergy: No unusual infections.  Allergy .   REST of 12 system review negative except as per HPI   Past Medical History:  Diagnosis Date   Abnormal Pap smear of anus    Anemia 2010   hx of   Arthritis    Basal cell carcinoma    leg 2020   Endometriosis    GERD (gastroesophageal reflux disease) 2012   hx of, none recent   History of blood transfusion 2010   no reaction   History of herpes labialis 08/01/2011   Periumbilical abdominal pain 03/11/2013   doubt uti but cx pending consdier small umbi hernia . refer  options of eval discussed     Pneumonia 1994   hx of   PPD positive    Recurrent HSV (herpes simplex virus)    S/P hip replacement 08/01/2011    Past  Surgical History:  Procedure Laterality Date   JOINT REPLACEMENT Right 2010   hip   LAPAROTOMY  1980's, 1991   exploratory, laser for endometriosis   THYROIDECTOMY, PARTIAL  1973   TOTAL HIP ARTHROPLASTY Left 11/05/2013   Procedure: LEFT TOTAL HIP ARTHROPLASTY ANTERIOR APPROACH;  Surgeon: Aurther Blue, MD;  Location: [facility];  Service: Orthopedics;  Laterality: Left;    Family History  Problem Relation Age of Onset   Hypertension Mother    Arthritis Mother        spinal stenosis   Lymphoma Other    Other Other        bowel disease   Autoimmune disease Sister        cold agglutinin hemolytic anemia presumed autoimmune    Breast cancer Neg Hx     Social History   Socioeconomic History   Marital status: Married    Spouse name: Not on file   Number of children: Not on file   Years of education: Not on file   Highest education level: Not on file  Occupational History   Not on file  Tobacco Use   Smoking status: Former    Current packs/day: 0.00    Types: Cigarettes    Start date: 08/22/1971    Quit date: 08/21/1986    Years since  quitting: 37.3   Smokeless tobacco: Never  Vaping Use   Vaping status: Never Used  Substance and Sexual Activity   Alcohol use: Yes    Comment: rare   Drug use: No   Sexual activity: Yes  Other Topics Concern   Not on file  Social History Narrative   Married   Regular exercise- yes   Cutting back on caffeine   Sleep 7    HH 3 pet dog and cat.    Works hospital nursing   G4 P1    Social Drivers of Corporate investment banker Strain: Low Risk  (02/27/2022)   Overall Financial Resource Strain (CARDIA)    Difficulty of Paying Living Expenses: Not hard at all  Food Insecurity: No Food Insecurity (02/27/2022)   Hunger Vital Sign    Worried About Running Out of Food in the Last Year: Never true    Ran Out of Food in the Last Year: Never true  Transportation Needs: No Transportation Needs (02/27/2022)   PRAPARE - Scientist, research (physical sciences) (Medical): No    Lack of Transportation (Non-Medical): No  Physical Activity: Sufficiently Active (02/27/2022)   Exercise Vital Sign    Days of Exercise per Week: 3 days    Minutes of Exercise per Session: 60 min  Stress: No Stress Concern Present (02/27/2022)   [employer] of Occupational Health - Occupational Stress Questionnaire    Feeling of Stress : Not at all  Social Connections: Moderately Isolated (02/10/2021)   Social Connection and Isolation Panel [NHANES]    Frequency of Communication with Friends and Family: Three times a week    Frequency of Social Gatherings with Friends and Family: Three times a week    Attends Religious Services: Never    Active Member of Clubs or Organizations: No    Attends Banker Meetings: Never    Marital Status: Married    Outpatient Medications Prior to Visit  Medication Sig Dispense Refill   alendronate  (FOSAMAX ) 70 MG tablet TAKE 1 TABLET (70 MG TOTAL) BY MOUTH ONCE A WEEK. TAKE WITH A FULL GLASS OF WATER  ON AN EMPTY STOMACH. 12 tablet 1   azelastine (ASTELIN) 0.1 % nasal spray Place 1 spray into both nostrils daily. Use in each nostril as directed     Cholecalciferol (VITAMIN D3) 5000 UNITS CAPS Take by mouth. 1 CAPSULE THREE TIMES WEEKLY     Fiber Adult Gummies 2 g CHEW 1 gummy Orally twice a day     influenza vaccine adjuvanted (FLUAD) 0.5 ML injection Inject into the muscle. 0.5 mL 0   LINZESS 72 MCG capsule Take 72 mcg by mouth daily.     loratadine (CLARITIN) 10 MG tablet Take 10 mg daily by mouth.     MYRBETRIQ 50 MG TB24 tablet Take 50 mg by mouth daily. Takes as needed     naproxen sodium (ALEVE) 220 MG tablet      polyethylene glycol powder (GLYCOLAX /MIRALAX ) 17 GM/SCOOP powder  (Patient not taking: Reported on 12/11/2023)     Tdap (BOOSTRIX ) 5-2.5-18.5 LF-MCG/0.5 injection Inject into the muscle. 0.5 mL 0   valACYclovir  (VALTREX ) 1000 MG tablet Take 2 tablets (2,000 mg total) by mouth 2 (two) times  daily as needed for fever blisters 30 tablet 5   vitamin C (ASCORBIC ACID) 500 MG tablet Take 500 mg three times weekly     Zoster Vaccine Adjuvanted (SHINGRIX ) injection Inject into the muscle. 0.5 mL 0   Zoster Vaccine  Adjuvanted (SHINGRIX ) injection Inject into the muscle. 0.5 mL 0   No facility-administered medications prior to visit.     EXAM:  There were no vitals taken for this visit.  There is no height or weight on file to calculate BMI. Wt Readings from Last 3 Encounters:  12/11/23 153 lb 6.4 oz (69.6 kg)  08/10/22 147 lb (66.7 kg)  02/27/22 145 lb (65.8 kg)    Physical Exam: Vital signs reviewed JYN:WGNF is a well-developed well-nourished alert cooperative    who appearsr stated age in no acute distress.  HEENT: normocephalic atraumatic , Eyes: PERRL EOM's full, conjunctiva clear, Nares: paten,t no deformity discharge or tenderness., Ears: no deformity EAC's clear TMs with normal landmarks. Mouth: clear OP, no lesions, edema.  Moist mucous membranes. Dentition in adequate repair. NECK: supple without masses, thyromegaly or bruits. CHEST/PULM:  Clear to auscultation and percussion breath sounds equal no wheeze , rales or rhonchi. No chest wall deformities or tenderness. Breast: normal by inspection . No dimpling, discharge, masses, tenderness or discharge . CV: PMI is nondisplaced, S1 S2 no gallops, murmurs, rubs. Peripheral pulses are full without delay.No JVD .  ABDOMEN: Bowel sounds normal nontender  No guard or rebound, no hepato splenomegal no CVA tenderness.   Extremtities:  No clubbing cyanosis or edema, no acute joint swelling or redness no focal atrophy NEURO:  Oriented x3, cranial nerves 3-12 appear to be intact, no obvious focal weakness,gait within normal limits no abnormal reflexes or asymmetrical SKIN: No acute rashes normal turgor, color, no bruising or petechiae. PSYCH: Oriented, good eye contact, no obvious depression anxiety, cognition and judgment appear  normal. LN: no cervical axillary i adenopathy  Lab Results  Component Value Date   WBC 4.5 12/19/2023   HGB 13.3 12/19/2023   HCT 40.6 12/19/2023   PLT 210.0 12/19/2023   GLUCOSE 96 12/19/2023   CHOL 179 12/19/2023   TRIG 66.0 12/19/2023   HDL 66.30 12/19/2023   LDLCALC 99 12/19/2023   ALT 17 12/19/2023   AST 19 12/19/2023   NA 139 12/19/2023   K 4.2 12/19/2023   CL 104 12/19/2023   CREATININE 0.80 12/19/2023   BUN 15 12/19/2023   CO2 30 12/19/2023   TSH 1.46 12/19/2023   INR 0.95 10/28/2013   HGBA1C 5.7 12/19/2023    BP Readings from Last 3 Encounters:  12/11/23 (!) 130/90  08/10/22 118/68  02/10/21 110/62    Lab results reviewed with patient   ASSESSMENT AND PLAN:  Discussed the following assessment and plan:  No diagnosis found. No follow-ups on file.  Patient Care Team: Stephen Baruch, Joaquim Muir, MD as PCP - Luwanna Sam, NP as Nurse Practitioner (Obstetrics and Gynecology) Liliane Rei, MD as Consulting Physician (Orthopedic Surgery) Glory Larsen, MD as Referring Physician (Dermatology) There are no Patient Instructions on file for this visit.  Brewer Hitchman K. Binyamin Nelis M.D.

## 2023-12-25 ENCOUNTER — Ambulatory Visit (INDEPENDENT_AMBULATORY_CARE_PROVIDER_SITE_OTHER): Admitting: Internal Medicine

## 2023-12-25 ENCOUNTER — Encounter: Payer: Self-pay | Admitting: Internal Medicine

## 2023-12-25 VITALS — BP 108/64 | HR 56 | Temp 97.7°F | Ht 63.5 in | Wt 153.0 lb

## 2023-12-25 DIAGNOSIS — M810 Age-related osteoporosis without current pathological fracture: Secondary | ICD-10-CM

## 2023-12-25 DIAGNOSIS — Z0000 Encounter for general adult medical examination without abnormal findings: Secondary | ICD-10-CM | POA: Diagnosis not present

## 2023-12-25 DIAGNOSIS — Z96649 Presence of unspecified artificial hip joint: Secondary | ICD-10-CM

## 2023-12-25 DIAGNOSIS — Z79899 Other long term (current) drug therapy: Secondary | ICD-10-CM

## 2024-02-07 ENCOUNTER — Other Ambulatory Visit: Payer: Self-pay | Admitting: Family

## 2024-02-29 ENCOUNTER — Other Ambulatory Visit: Payer: Self-pay | Admitting: Internal Medicine

## 2024-02-29 NOTE — Telephone Encounter (Unsigned)
 Copied from CRM [id number]. Topic: Clinical - Medication Refill >> Feb 29, 2024 10:56 AM Shereese L wrote: Medication: alendronate  (FOSAMAX ) 70 MG tablet    Has the patient contacted their pharmacy? Yes (Agent: If no, request that the patient contact the pharmacy for the refill. If patient does not wish to contact the pharmacy document the reason why and proceed with request.) (Agent: If yes, when and what did the pharmacy advise?)  This is the patient's preferred pharmacy:  [id number] [address], [place] - [address] [place] [address] Phone: [phone] Fax: [phone]   Is this the correct pharmacy for this prescription? Yes If no, delete pharmacy and type the correct one.   Has the prescription been filled recently? Yes  Is the patient out of the medication? No  Has the patient been seen for an appointment in the last year OR does the patient have an upcoming appointment? Yes  Can we respond through MyChart? Yes  Agent: Please be advised that Rx refills may take up to 3 business days. We ask that you follow-up with your pharmacy.

## 2024-03-12 ENCOUNTER — Telehealth: Payer: Self-pay

## 2024-03-12 MED ORDER — ALENDRONATE SODIUM 70 MG PO TABS: 70.0000 mg | ORAL_TABLET | ORAL | 1 refills | Status: DC

## 2024-03-12 NOTE — Telephone Encounter (Signed)
 Copied from CRM [id number]. Topic: Clinical - Medication Refill >> Feb 29, 2024 10:56 AM Shereese L wrote: Medication: alendronate  (FOSAMAX ) 70 MG tablet    Has the patient contacted their pharmacy? Yes (Agent: If no, request that the patient contact the pharmacy for the refill. If patient does not wish to contact the pharmacy document the reason why and proceed with request.) (Agent: If yes, when and what did the pharmacy advise?)  This is the patient's preferred pharmacy:  [id number] [address], [place] - [address] [place] [address] Phone: [phone] Fax: [phone]   Is this the correct pharmacy for this prescription? Yes If no, delete pharmacy and type the correct one.   Has the prescription been filled recently? Yes  Is the patient out of the medication? No  Has the patient been seen for an appointment in the last year OR does the patient have an upcoming appointment? Yes  Can we respond through MyChart? Yes  Agent: Please be advised that Rx refills may take up to 3 business days. We ask that you follow-up with your pharmacy. >> Mar 11, 2024  9:53 AM Franky GRADE wrote: Patient is calling to follow up on the refill request the pharmacy submitted as well as the patient herself calling in to place for alendronate  (FOSAMAX ) 70 MG tablet [[id number]] , pharmacy informed patient they had not gotten a response and patient has not heard back either.

## 2024-03-12 NOTE — Addendum Note (Signed)
 Addended by: Eleonor Ocon on: 03/12/2024 03:30 PM   Modules accepted: Orders

## 2024-03-12 NOTE — Telephone Encounter (Signed)
 Attempted to reach pt. Left a detail message that Rx request has sent and if have any questions give us  a call back.

## 2024-04-17 ENCOUNTER — Encounter: Payer: Self-pay | Admitting: Family Medicine

## 2024-04-17 ENCOUNTER — Ambulatory Visit (INDEPENDENT_AMBULATORY_CARE_PROVIDER_SITE_OTHER): Admitting: Family Medicine

## 2024-04-17 DIAGNOSIS — Z0000 Encounter for general adult medical examination without abnormal findings: Secondary | ICD-10-CM

## 2024-04-17 NOTE — Progress Notes (Signed)
 PATIENT CHECK-IN and HEALTH RISK ASSESSMENT QUESTIONNAIRE:  -completed by phone/video for upcoming Medicare Preventive Visit  Pre-Visit Check-in: 1)Vitals (height, wt, BP, etc) - record in vitals section for visit on day of visit Request home vitals (wt, BP, etc.) and enter into vitals, THEN update Vital Signs SmartPhrase below at the top of the HPI. See below.  2)Review and Update Medications, Allergies PMH, Surgeries, Social history in Epic 3)Hospitalizations in the last year with date/reason? no  4)Review and Update Care Team (patient's specialists) in Epic 5) Complete PHQ9 in Epic  6) Complete Fall Screening in Epic 7)Review all Health Maintenance Due and order if not done.  Medicare Wellness Patient Questionnaire:  Answer theses question about your habits: How often do you have a drink containing alcohol? 1 How many drinks containing alcohol do you have on a typical day when you are drinking? 1 How often do you have six or more drinks on one occasion? never Have you ever smoked?yes Quit date if applicable? 1980's  How many packs a day do/did you smoke? 1/2 pack a day  Do you use smokeless tobacco? No Do you use an illicit drugs? No On average, how many days per week do you engage in moderate to strenuous exercise (like a brisk walk)? 3 On average, how many minutes do you engage in exercise at this level? 45 minutes to an hr. Pilates, balance exercises, cardio, weight lifting Are you sexually active? Yes Number of partners?1  Typical breakfast yogurt, granola, fruit and oatmeal Typical lunch half of sandwich ( cheese ham) salad apple  Typical dinner: Chicken, Salmon or beef with vegetables Typical snacks: mixed nuts, crackers or fruit   Beverages: 3 cups of coffee a day 1 diet soda sparkling water  unsweetened tea.   Answer theses question about your everyday activities: Can you perform most household chores?yes Are you deaf or have significant trouble hearing? No  Do you feel  that you have a problem with memory? No Do you feel safe at home? Yes  Last dentist visit? July 2025 8. Do you have any difficulty performing your everyday activities? No Are you having any difficulty walking, taking medications on your own, and or difficulty managing daily home needs? No Do you have difficulty walking or climbing stairs?  no Do you have difficulty dressing or bathing? No Do you have difficulty doing errands alone such as visiting a doctor's office or shopping? No  Do you currently have any difficulty preparing food and eating? No  Do you currently have any difficulty using the toilet? No Do you have any difficulty managing your finances? No  Do you have any difficulties with housekeeping of managing your housekeeping? No    Do you have Advanced Directives in place (Living Will, Healthcare Power or Attorney)? Yes   Last eye Exam and location? 10/2023 Do you currently use prescribed or non-prescribed narcotic or opioid pain medications?cNo  Do you have a history or close family history of breast, ovarian, tubal or peritoneal cancer or a family member with BRCA (breast cancer susceptibility 1 and 2) gene mutations? Se PMH/FH   Nurse/Assistant Credentials/time stamp:KJ    ----------------------------------------------------------------------------------------------------------------------------------------------------------------------------------------------------------------------  Because this visit was a virtual/telehealth visit, some criteria may be missing or patient reported. Any vitals not documented were not able to be obtained and vitals that have been documented are patient reported.    MEDICARE ANNUAL PREVENTIVE VISIT WITH PROVIDER: (Welcome to Medicare, initial annual wellness or annual wellness exam)  Virtual Visit via Video Note  I connected with Katherine Rodriguez on 04/17/24 by a video enabled telemedicine application and verified that I am speaking with  the correct person using two identifiers.  Location patient: home Location provider:work or home office Persons participating in the virtual visit: patient, provider  Concerns and/or follow up today: no concerns   See HM section in Epic for other details of completed HM.    ROS: negative for report of fevers, unintentional weight loss, vision changes, vision loss, hearing loss or change, chest pain, sob, hemoptysis, melena, hematochezia, hematuria, falls, bleeding or bruising, thoughts of suicide or self harm, memory loss  Patient-completed extensive health risk assessment - reviewed and discussed with the patient: See Health Risk Assessment completed with patient prior to the visit either above or in recent phone note. This was reviewed in detailed with the patient today and appropriate recommendations, orders and referrals were placed as needed per Summary below and patient instructions.   Review of Medical History: -PMH, PSH, Family History and current specialty and care providers reviewed and updated and listed below   Patient Care Team: Panosh, Apolinar POUR, MD as PCP - General Haverstock, Tawni LITTIE, MD as Referring Physician (Dermatology) Charmayne Molly, MD as Consulting Physician (Ophthalmology)   Past Medical History:  Diagnosis Date   Abnormal Pap smear of anus    Anemia 2010   hx of   Arthritis    Basal cell carcinoma    leg 2020   Endometriosis    GERD (gastroesophageal reflux disease) 2012   hx of, none recent   History of blood transfusion 2010   no reaction   History of herpes labialis 08/01/2011   Periumbilical abdominal pain 03/11/2013   doubt uti but cx pending consdier small umbi hernia . refer  options of eval discussed     Pneumonia 1994   hx of   PPD positive    Recurrent HSV (herpes simplex virus)    S/P hip replacement 08/01/2011    Past Surgical History:  Procedure Laterality Date   JOINT REPLACEMENT Right 2010   hip   LAPAROTOMY  1980's, 1991    exploratory, laser for endometriosis   THYROIDECTOMY, PARTIAL  1973   TOTAL HIP ARTHROPLASTY Left 11/05/2013   Procedure: LEFT TOTAL HIP ARTHROPLASTY ANTERIOR APPROACH;  Surgeon: Dempsey LULLA Moan, MD;  Location: [facility];  Service: Orthopedics;  Laterality: Left;    Social History   Socioeconomic History   Marital status: Married    Spouse name: Not on file   Number of children: Not on file   Years of education: Not on file   Highest education level: Not on file  Occupational History   Not on file  Tobacco Use   Smoking status: Former    Current packs/day: 0.00    Types: Cigarettes    Start date: 08/22/1971    Quit date: 08/21/1986    Years since quitting: 37.6   Smokeless tobacco: Never  Vaping Use   Vaping status: Never Used  Substance and Sexual Activity   Alcohol use: Yes    Comment: rare   Drug use: No   Sexual activity: Yes  Other Topics Concern   Not on file  Social History Narrative   Married   Regular exercise- yes   Cutting back on caffeine   Sleep 7    HH 3 pet dog and cat.    Works hospital nursing   G4 P1    Social Drivers of Corporate investment banker Strain:  Low Risk  (02/27/2022)   Overall Financial Resource Strain (CARDIA)    Difficulty of Paying Living Expenses: Not hard at all  Food Insecurity: No Food Insecurity (02/27/2022)   Hunger Vital Sign    Worried About Running Out of Food in the Last Year: Never true    Ran Out of Food in the Last Year: Never true  Transportation Needs: No Transportation Needs (02/27/2022)   PRAPARE - Administrator, Civil Service (Medical): No    Lack of Transportation (Non-Medical): No  Physical Activity: Insufficiently Active (12/25/2023)   Exercise Vital Sign    Days of Exercise per Week: 3 days    Minutes of Exercise per Session: 40 min  Stress: No Stress Concern Present (02/27/2022)   [employer] of Occupational Health - Occupational Stress Questionnaire    Feeling of Stress : Not at all  Social  Connections: Moderately Isolated (02/10/2021)   Social Connection and Isolation Panel    Frequency of Communication with Friends and Family: Three times a week    Frequency of Social Gatherings with Friends and Family: Three times a week    Attends Religious Services: Never    Active Member of Clubs or Organizations: No    Attends Banker Meetings: Never    Marital Status: Married  Catering manager Violence: Not At Risk (02/10/2021)   Humiliation, Afraid, Rape, and Kick questionnaire    Fear of Current or Ex-Partner: No    Emotionally Abused: No    Physically Abused: No    Sexually Abused: No    Family History  Problem Relation Age of Onset   Hypertension Mother    Arthritis Mother        spinal stenosis   Lymphoma Other    Other Other        bowel disease   Autoimmune disease Sister        cold agglutinin hemolytic anemia presumed autoimmune    Breast cancer Neg Hx     Current Outpatient Medications on File Prior to Visit  Medication Sig Dispense Refill   alendronate  (FOSAMAX ) 70 MG tablet Take 1 tablet (70 mg total) by mouth once a week. Take with a full glass of water  on an empty stomach. 12 tablet 1   azelastine (ASTELIN) 0.1 % nasal spray Place 1 spray into both nostrils daily. Use in each nostril as directed     Cholecalciferol (VITAMIN D3) 5000 UNITS CAPS Take by mouth. 1 CAPSULE THREE TIMES WEEKLY (Patient taking differently: Take 1,000 Units by mouth daily. 1 CAPSULE THREE TIMES WEEKLY)     Collagen-Vitamin C-Biotin (COLLAGEN PO) Collagen Powder 1 scoop in 8 ounces of water  orally once a day     Fiber Adult Gummies 2 g CHEW 1 gummy Orally twice a day     LINZESS 72 MCG capsule Take 72 mcg by mouth daily.     loratadine (CLARITIN) 10 MG tablet Take 10 mg daily by mouth.     naproxen sodium (ALEVE) 220 MG tablet as needed.     valACYclovir  (VALTREX ) 1000 MG tablet Take 2 tablets (2,000 mg total) by mouth 2 (two) times daily as needed for fever blisters 30  tablet 5   vitamin C (ASCORBIC ACID) 500 MG tablet Take 500 mg three times weekly     MYRBETRIQ 50 MG TB24 tablet Take 50 mg by mouth daily. Takes as needed     polyethylene glycol powder (GLYCOLAX /MIRALAX ) 17 GM/SCOOP powder  (Patient not taking: Reported on  12/25/2023)     Probiotic TABS 1 capsule.     No current facility-administered medications on file prior to visit.    No Known Allergies     Physical Exam Vitals requested from patient and listed below if patient had equipment and was able to obtain at home for this virtual visit: There were no vitals filed for this visit. Estimated body mass index is 26.68 kg/m as calculated from the following:   Height as of 12/25/23: 5' 3.5 (1.613 m).   Weight as of 12/25/23: 153 lb (69.4 kg).  EKG (optional): deferred due to virtual visit  GENERAL: alert, oriented, no acute distress detected, full vision exam deferred due to pandemic and/or virtual encounter  HEENT: atraumatic, conjunttiva clear, no obvious abnormalities on inspection of external nose and ears  NECK: normal movements of the head and neck  LUNGS: on inspection no signs of respiratory distress, breathing rate appears normal, no obvious gross SOB, gasping or wheezing  CV: no obvious cyanosis  MS: moves all visible extremities without noticeable abnormality  PSYCH/NEURO: pleasant and cooperative, no obvious depression or anxiety, speech and thought processing grossly intact, Cognitive function grossly intact  Flowsheet Row Office Visit from 08/10/2022 in [facility] [place]  PHQ-9 Total Score 2        04/17/2024    4:59 PM 03/01/2023   10:16 AM 08/10/2022    2:14 PM 02/27/2022    1:50 PM 02/10/2021    9:21 AM  Depression screen PHQ 2/9  Decreased Interest 0 0 0 0 0  Down, Depressed, Hopeless 0 0 0 0 0  PHQ - 2 Score 0 0 0 0 0  Altered sleeping   1    Tired, decreased energy   0    Change in appetite   0    Feeling bad or failure about  yourself    0    Trouble concentrating   1    Moving slowly or fidgety/restless   0    Suicidal thoughts   0    PHQ-9 Score   2    Difficult doing work/chores   Not difficult at all         02/10/2021    9:21 AM 02/27/2022    1:50 PM 08/10/2022    2:14 PM 03/01/2023   10:16 AM 04/17/2024    4:59 PM  Fall Risk  Falls in the past year? 0 0 0 0 0  Was there an injury with Fall? 0 0 0 0 0  Fall Risk Category Calculator 0 0 0 0 0  Fall Risk Category (Retired) Low  Low  Low     (RETIRED) Patient Fall Risk Level Low fall risk  Low fall risk  Low fall risk     Patient at Risk for Falls Due to  Medication side effect No Fall Risks No Fall Risks   Fall risk Follow up Falls evaluation completed  Falls evaluation completed;Education provided;Falls prevention discussed  Falls evaluation completed   Falls evaluation completed;Education provided     Data saved with a previous flowsheet row definition     SUMMARY AND PLAN:  Encounter for Medicare annual wellness exam   Discussed applicable health maintenance/preventive health measures and advised and referred or ordered per patient preferences: -she had her cologuard test - I updated that manually in HM -discussed vaccines due and she plans to get at the pharmacy, advised to provide record when does so that we can update her chart  Health Maintenance  Topic Date Due   COVID-19 Vaccine (4 - 2024-25 season) 04/22/2023   INFLUENZA VACCINE  03/21/2024   MAMMOGRAM  10/21/2024   Medicare Annual Wellness (AWV)  04/17/2025   Fecal DNA (Cologuard)  11/09/2025   DTaP/Tdap/Td (6 - Td or Tdap) 03/03/2031   Pneumococcal Vaccine: 50+ Years  Completed   DEXA SCAN  Completed   Hepatitis C Screening  Completed   Zoster Vaccines- Shingrix   Completed   HPV VACCINES  Aged Out   Meningococcal B Vaccine  Aged Out   Colonoscopy  Discontinued      Education and counseling on the following was provided based on the above review of health and a  plan/checklist for the patient, along with additional information discussed, was provided for the patient in the patient instructions :   -Advised and counseled on a healthy lifestyle - -Reviewed patient's current diet. Advised and counseled on a whole foods based healthy diet. A summary of a healthy diet was provided in the Patient Instructions.  -reviewed patient's current physical activity level and discussed exercise guidelines for adults. Congratulated on healthy habits and further resources provided in pt instructions.  -Advise yearly dental visits at minimum and regular eye exams   Follow up: see patient instructions     Patient Instructions  I really enjoyed getting to talk with you today! I am available on Tuesdays and Thursdays for virtual visits if you have any questions or concerns, or if I can be of any further assistance.   CHECKLIST FROM ANNUAL WELLNESS VISIT:  -Follow up (please call to schedule if not scheduled after visit):   -yearly for annual wellness visit with primary care office  Here is a list of your preventive care/health maintenance measures and the plan for each if any are due:  PLAN For any measures below that may be due:    1. Can get vaccines due at the pharmacy. Please bring proof of receipt to office so that we can update your vaccine record. Thanks!  Health Maintenance  Topic Date Due   Colonoscopy  10/15/2021   COVID-19 Vaccine (4 - 2024-25 season) 04/22/2023   Medicare Annual Wellness (AWV)  02/29/2024   INFLUENZA VACCINE  03/21/2024   MAMMOGRAM  10/21/2024   DTaP/Tdap/Td (6 - Td or Tdap) 03/03/2031   Pneumococcal Vaccine: 50+ Years  Completed   DEXA SCAN  Completed   Hepatitis C Screening  Completed   Zoster Vaccines- Shingrix   Completed   HPV VACCINES  Aged Out   Meningococcal B Vaccine  Aged Out    -See a dentist at least yearly  -Get your eyes checked and then per your eye specialist's recommendations  -Other issues addressed  today:   -I have included below further information regarding a healthy whole foods based diet, physical activity guidelines for adults, stress management and opportunities for social connections. I hope you find this information useful.   -----------------------------------------------------------------------------------------------------------------------------------------------------------------------------------------------------------------------------------------------------------    NUTRITION: -eat real food: lots of colorful vegetables (half the plate) and fruits -5-7 servings of vegetables and fruits per day (fresh or steamed is best), exp. 2 servings of vegetables with lunch and dinner and 2 servings of fruit per day. Berries and greens such as kale and collards are great choices.  -consume on a regular basis:  fresh fruits, fresh veggies, fish, nuts, seeds, healthy oils (such as olive oil, avocado oil), whole grains (make sure for bread/pasta/crackers/etc., that the first ingredient on label contains the word whole), legumes. -can eat  small amounts of dairy and lean meat (no larger than the palm of your hand), but avoid processed meats such as ham, bacon, lunch meat, etc. -drink water  -try to avoid fast food and pre-packaged foods, processed meat, ultra processed foods/beverages (donuts, candy, etc.) -most experts advise limiting sodium to < 2300mg  per day, should limit further is any chronic conditions such as high blood pressure, heart disease, diabetes, etc. The American Heart Association advised that < 1500mg  is is ideal -try to avoid foods/beverages that contain any ingredients with names you do not recognize  -try to avoid foods/beverages  with added sugar or sweeteners/sweets  -try to avoid sweet drinks (including diet drinks): soda, juice, Gatorade, sweet tea, power drinks, diet drinks -try to avoid white rice, white bread, pasta (unless whole grain)  EXERCISE GUIDELINES  FOR ADULTS: -if you wish to increase your physical activity, do so gradually and with the approval of your doctor -STOP and seek medical care immediately if you have any chest pain, chest discomfort or trouble breathing when starting or increasing exercise  -move and stretch your body, legs, feet and arms when sitting for long periods -Physical activity guidelines for optimal health in adults: -get at least 150 minutes per week of moderate exercise (can talk, but not sing); this is about 20-30 minutes of sustained activity 5-7 days per week or two 10-15 minute episodes of sustained activity 5-7 days per week -do some muscle building/resistance training/strength training at least 2 days per week  -balance exercises 3+ days per week:   Stand somewhere where you have something sturdy to hold onto if you lose balance    1) lift up on toes, then back down, start with 5x per day and work up to 20x   2) stand and lift one leg straight out to the side so that foot is a few inches of the floor, start with 5x each side and work up to 20x each side   3) stand on one foot, start with 5 seconds each side and work up to 20 seconds on each side  If you need ideas or help with getting more active:  -Silver sneakers https://tools.silversneakers.com  -Walk with a Doc: http://www.duncan-williams.com/  -try to include resistance (weight lifting/strength building) and balance exercises twice per week: or the following link for ideas: http://castillo-powell.com/  BuyDucts.dk  STRESS MANAGEMENT: -can try meditating, or just sitting quietly with deep breathing while intentionally relaxing all parts of your body for 5 minutes daily -if you need further help with stress, anxiety or depression please follow up with your primary doctor or contact the wonderful folks at [facility]: [phone]  SOCIAL  CONNECTIONS: -options in [place] if you wish to engage in more social and exercise related activities:  -Silver sneakers https://tools.silversneakers.com  -Walk with a Doc: http://www.duncan-williams.com/  -Check out the [facility] Active Adults 50+ section on the [place] [employer] (hiking clubs, book clubs, cards and games, chess, exercise classes, aquatic classes and much more) - see the website for details: https://www.[place].gov/departments/parks-recreation/active-adults50  -YouTube has lots of exercise videos for different ages and abilities as well  -[facility] (a variety of indoor and outdoor inperson activities for adults). [phone]. [address]  -Virtual Online Classes (a variety of topics): see seniorplanet.org or call [phone]  -consider volunteering at a school, hospice center, church, senior center or elsewhere            Katherine JONELLE Cramp, DO

## 2024-04-17 NOTE — Patient Instructions (Signed)
 I really enjoyed getting to talk with you today! I am available on Tuesdays and Thursdays for virtual visits if you have any questions or concerns, or if I can be of any further assistance.   CHECKLIST FROM ANNUAL WELLNESS VISIT:  -Follow up (please call to schedule if not scheduled after visit):   -yearly for annual wellness visit with primary care office  Here is a list of your preventive care/health maintenance measures and the plan for each if any are due:  PLAN For any measures below that may be due:    1. Can get vaccines due at the pharmacy. Please bring proof of receipt to office so that we can update your vaccine record. Thanks!  Health Maintenance  Topic Date Due   Colonoscopy  10/15/2021   COVID-19 Vaccine (4 - 2024-25 season) 04/22/2023   Medicare Annual Wellness (AWV)  02/29/2024   INFLUENZA VACCINE  03/21/2024   MAMMOGRAM  10/21/2024   DTaP/Tdap/Td (6 - Td or Tdap) 03/03/2031   Pneumococcal Vaccine: 50+ Years  Completed   DEXA SCAN  Completed   Hepatitis C Screening  Completed   Zoster Vaccines- Shingrix   Completed   HPV VACCINES  Aged Out   Meningococcal B Vaccine  Aged Out    -See a dentist at least yearly  -Get your eyes checked and then per your eye specialist's recommendations  -Other issues addressed today:   -I have included below further information regarding a healthy whole foods based diet, physical activity guidelines for adults, stress management and opportunities for social connections. I hope you find this information useful.   -----------------------------------------------------------------------------------------------------------------------------------------------------------------------------------------------------------------------------------------------------------    NUTRITION: -eat real food: lots of colorful vegetables (half the plate) and fruits -5-7 servings of vegetables and fruits per day (fresh or steamed is best), exp. 2  servings of vegetables with lunch and dinner and 2 servings of fruit per day. Berries and greens such as kale and collards are great choices.  -consume on a regular basis:  fresh fruits, fresh veggies, fish, nuts, seeds, healthy oils (such as olive oil, avocado oil), whole grains (make sure for bread/pasta/crackers/etc., that the first ingredient on label contains the word whole), legumes. -can eat small amounts of dairy and lean meat (no larger than the palm of your hand), but avoid processed meats such as ham, bacon, lunch meat, etc. -drink water  -try to avoid fast food and pre-packaged foods, processed meat, ultra processed foods/beverages (donuts, candy, etc.) -most experts advise limiting sodium to < 2300mg  per day, should limit further is any chronic conditions such as high blood pressure, heart disease, diabetes, etc. The American Heart Association advised that < 1500mg  is is ideal -try to avoid foods/beverages that contain any ingredients with names you do not recognize  -try to avoid foods/beverages  with added sugar or sweeteners/sweets  -try to avoid sweet drinks (including diet drinks): soda, juice, Gatorade, sweet tea, power drinks, diet drinks -try to avoid white rice, white bread, pasta (unless whole grain)  EXERCISE GUIDELINES FOR ADULTS: -if you wish to increase your physical activity, do so gradually and with the approval of your doctor -STOP and seek medical care immediately if you have any chest pain, chest discomfort or trouble breathing when starting or increasing exercise  -move and stretch your body, legs, feet and arms when sitting for long periods -Physical activity guidelines for optimal health in adults: -get at least 150 minutes per week of moderate exercise (can talk, but not sing); this is about 20-30 minutes of sustained  activity 5-7 days per week or two 10-15 minute episodes of sustained activity 5-7 days per week -do some muscle building/resistance  training/strength training at least 2 days per week  -balance exercises 3+ days per week:   Stand somewhere where you have something sturdy to hold onto if you lose balance    1) lift up on toes, then back down, start with 5x per day and work up to 20x   2) stand and lift one leg straight out to the side so that foot is a few inches of the floor, start with 5x each side and work up to 20x each side   3) stand on one foot, start with 5 seconds each side and work up to 20 seconds on each side  If you need ideas or help with getting more active:  -Silver sneakers https://tools.silversneakers.com  -Walk with a Doc: http://www.duncan-williams.com/  -try to include resistance (weight lifting/strength building) and balance exercises twice per week: or the following link for ideas: http://castillo-powell.com/  BuyDucts.dk  STRESS MANAGEMENT: -can try meditating, or just sitting quietly with deep breathing while intentionally relaxing all parts of your body for 5 minutes daily -if you need further help with stress, anxiety or depression please follow up with your primary doctor or contact the wonderful folks at [facility]: [phone]  SOCIAL CONNECTIONS: -options in [place] if you wish to engage in more social and exercise related activities:  -Silver sneakers https://tools.silversneakers.com  -Walk with a Doc: http://www.duncan-williams.com/  -Check out the [facility] Active Adults 50+ section on the [place] [employer] (hiking clubs, book clubs, cards and games, chess, exercise classes, aquatic classes and much more) - see the website for details: https://www.-[place].gov/departments/parks-recreation/active-adults50  -YouTube has lots of exercise videos for different ages and abilities as well  -[facility] (a variety of indoor and outdoor inperson activities for  adults). [phone]. [address].  -Virtual Online Classes (a variety of topics): see seniorplanet.org or call [phone]  -consider volunteering at a school, hospice center, church, senior center or elsewhere

## 2024-05-19 ENCOUNTER — Other Ambulatory Visit (HOSPITAL_BASED_OUTPATIENT_CLINIC_OR_DEPARTMENT_OTHER): Payer: Self-pay

## 2024-05-19 MED ORDER — FLUZONE HIGH-DOSE 0.5 ML IM SUSY
0.5000 mL | PREFILLED_SYRINGE | Freq: Once | INTRAMUSCULAR | 0 refills | Status: AC
Start: 2024-05-19 — End: 2024-05-20
  Filled 2024-05-19: qty 0.5, 1d supply, fill #0

## 2024-08-22 ENCOUNTER — Other Ambulatory Visit: Payer: Self-pay | Admitting: Internal Medicine

## 2024-09-25 ENCOUNTER — Other Ambulatory Visit: Payer: Self-pay

## 2024-09-25 ENCOUNTER — Other Ambulatory Visit (HOSPITAL_BASED_OUTPATIENT_CLINIC_OR_DEPARTMENT_OTHER): Payer: Self-pay

## 2024-09-25 ENCOUNTER — Other Ambulatory Visit: Payer: Self-pay | Admitting: Internal Medicine

## 2024-09-25 DIAGNOSIS — Z1231 Encounter for screening mammogram for malignant neoplasm of breast: Secondary | ICD-10-CM

## 2024-10-30 ENCOUNTER — Ambulatory Visit
